# Patient Record
Sex: Female | Born: 1950 | Race: White | Hispanic: No | Smoking: Never smoker
Health system: Southern US, Community
[De-identification: ages and names within clinical notes are randomized; demographics above are authoritative.]

## PROBLEM LIST (undated history)

## (undated) DIAGNOSIS — L570 Actinic keratosis: Secondary | ICD-10-CM

## (undated) DIAGNOSIS — M1990 Unspecified osteoarthritis, unspecified site: Secondary | ICD-10-CM

## (undated) DIAGNOSIS — Z9889 Other specified postprocedural states: Secondary | ICD-10-CM

## (undated) DIAGNOSIS — R0789 Other chest pain: Secondary | ICD-10-CM

## (undated) DIAGNOSIS — Z7982 Long term (current) use of aspirin: Secondary | ICD-10-CM

## (undated) DIAGNOSIS — E782 Mixed hyperlipidemia: Secondary | ICD-10-CM

## (undated) DIAGNOSIS — Z8669 Personal history of other diseases of the nervous system and sense organs: Secondary | ICD-10-CM

## (undated) DIAGNOSIS — E785 Hyperlipidemia, unspecified: Secondary | ICD-10-CM

## (undated) DIAGNOSIS — I341 Nonrheumatic mitral (valve) prolapse: Secondary | ICD-10-CM

## (undated) DIAGNOSIS — J302 Other seasonal allergic rhinitis: Secondary | ICD-10-CM

## (undated) DIAGNOSIS — Z8679 Personal history of other diseases of the circulatory system: Secondary | ICD-10-CM

## (undated) DIAGNOSIS — I700 Atherosclerosis of aorta: Secondary | ICD-10-CM

## (undated) HISTORY — DX: Long term (current) use of aspirin: Z79.82

## (undated) HISTORY — DX: Unspecified osteoarthritis, unspecified site: M19.90

## (undated) HISTORY — PX: TUBAL LIGATION: SHX77

## (undated) HISTORY — DX: Mixed hyperlipidemia: E78.2

## (undated) HISTORY — DX: Other chest pain: R07.89

## (undated) HISTORY — DX: Personal history of other diseases of the nervous system and sense organs: Z86.69

## (undated) HISTORY — DX: Personal history of other diseases of the circulatory system: Z86.79

## (undated) HISTORY — DX: Nonrheumatic mitral (valve) prolapse: I34.1

## (undated) HISTORY — DX: Actinic keratosis: L57.0

## (undated) HISTORY — DX: Atherosclerosis of aorta: I70.0

## (undated) HISTORY — DX: Hyperlipidemia, unspecified: E78.5

## (undated) HISTORY — PX: COLONOSCOPY: SHX174

## (undated) HISTORY — DX: Other specified postprocedural states: Z98.890

## (undated) HISTORY — DX: Other seasonal allergic rhinitis: J30.2

---

## 1997-08-24 ENCOUNTER — Other Ambulatory Visit: Admission: RE | Admit: 1997-08-24 | Discharge: 1997-08-24 | Payer: Self-pay | Admitting: Gynecology

## 1999-02-21 ENCOUNTER — Other Ambulatory Visit: Admission: RE | Admit: 1999-02-21 | Discharge: 1999-02-21 | Payer: Self-pay | Admitting: Gynecology

## 2000-02-20 ENCOUNTER — Other Ambulatory Visit: Admission: RE | Admit: 2000-02-20 | Discharge: 2000-02-20 | Payer: Self-pay | Admitting: Gynecology

## 2001-01-10 HISTORY — PX: CARDIAC CATHETERIZATION: SHX172

## 2001-01-18 ENCOUNTER — Ambulatory Visit (HOSPITAL_COMMUNITY): Admission: RE | Admit: 2001-01-18 | Discharge: 2001-01-18 | Payer: Self-pay | Admitting: Cardiology

## 2001-03-16 ENCOUNTER — Other Ambulatory Visit: Admission: RE | Admit: 2001-03-16 | Discharge: 2001-03-16 | Payer: Self-pay | Admitting: Gynecology

## 2002-03-11 ENCOUNTER — Encounter: Payer: Self-pay | Admitting: Gynecology

## 2002-03-11 ENCOUNTER — Encounter: Admission: RE | Admit: 2002-03-11 | Discharge: 2002-03-11 | Payer: Self-pay | Admitting: Gynecology

## 2002-03-30 ENCOUNTER — Other Ambulatory Visit: Admission: RE | Admit: 2002-03-30 | Discharge: 2002-03-30 | Payer: Self-pay | Admitting: Gynecology

## 2003-04-03 ENCOUNTER — Other Ambulatory Visit: Admission: RE | Admit: 2003-04-03 | Discharge: 2003-04-03 | Payer: Self-pay | Admitting: Gynecology

## 2003-04-05 ENCOUNTER — Encounter: Admission: RE | Admit: 2003-04-05 | Discharge: 2003-04-05 | Payer: Self-pay | Admitting: Gynecology

## 2004-04-08 ENCOUNTER — Encounter: Admission: RE | Admit: 2004-04-08 | Discharge: 2004-04-08 | Payer: Self-pay | Admitting: Gynecology

## 2004-04-08 ENCOUNTER — Other Ambulatory Visit: Admission: RE | Admit: 2004-04-08 | Discharge: 2004-04-08 | Payer: Self-pay | Admitting: Gynecology

## 2005-04-10 ENCOUNTER — Encounter: Admission: RE | Admit: 2005-04-10 | Discharge: 2005-04-10 | Payer: Self-pay | Admitting: Gynecology

## 2005-04-10 ENCOUNTER — Other Ambulatory Visit: Admission: RE | Admit: 2005-04-10 | Discharge: 2005-04-10 | Payer: Self-pay | Admitting: Gynecology

## 2006-04-15 ENCOUNTER — Other Ambulatory Visit: Admission: RE | Admit: 2006-04-15 | Discharge: 2006-04-15 | Payer: Self-pay | Admitting: Gynecology

## 2006-04-15 ENCOUNTER — Encounter: Admission: RE | Admit: 2006-04-15 | Discharge: 2006-04-15 | Payer: Self-pay | Admitting: Gynecology

## 2006-09-16 ENCOUNTER — Ambulatory Visit (HOSPITAL_BASED_OUTPATIENT_CLINIC_OR_DEPARTMENT_OTHER): Admission: RE | Admit: 2006-09-16 | Discharge: 2006-09-16 | Payer: Self-pay | Admitting: Gynecology

## 2006-09-16 ENCOUNTER — Encounter (INDEPENDENT_AMBULATORY_CARE_PROVIDER_SITE_OTHER): Payer: Self-pay | Admitting: Gynecology

## 2007-05-18 ENCOUNTER — Encounter: Admission: RE | Admit: 2007-05-18 | Discharge: 2007-05-18 | Payer: Self-pay | Admitting: Gynecology

## 2008-05-24 ENCOUNTER — Encounter: Admission: RE | Admit: 2008-05-24 | Discharge: 2008-05-24 | Payer: Self-pay | Admitting: Gynecology

## 2009-06-06 ENCOUNTER — Encounter: Admission: RE | Admit: 2009-06-06 | Discharge: 2009-06-06 | Payer: Self-pay | Admitting: Gynecology

## 2010-06-25 NOTE — Op Note (Signed)
NAME:  Sheila Martin, Sheila Martin               ACCOUNT NO.:  [id number]   MEDICAL RECORD NO.:  [id number]          PATIENT TYPE:  AMB   LOCATION:  [facility]                         FACILITY:  [facility]   PHYSICIAN:  Gretta Cool, M.D. DATE OF BIRTH:  Jul 09, 1950   DATE OF PROCEDURE:  09/16/2006  DATE OF DISCHARGE:                               OPERATIVE REPORT   PREOPERATIVE DIAGNOSES:  Postmenopausal bleeding with endometrial polyp  by ultrasound and confirmed by D&C with removal of a portion of the  polyp.   POSTOPERATIVE DIAGNOSES:  Postmenopausal bleeding with endometrial polyp  by ultrasound and confirmed by D&C with removal of a portion of the  polyp.   PROCEDURE:  Hysteroscopy resection of the endometrium totaled for  ablation plus VaporTrode.   SURGEON:  Gretta Cool, M.D.   ANESTHESIA:  IV sedation and paracervical block.   DESCRIPTION OF PROCEDURE:  Under excellent anesthesia as above with the  patient prepped and draped in Allen stirrups, a weighted speculum was  placed in the vagina and cervix grasped with a single tooth tenaculum  and pulled down into view. He was then progressively dilated with a  series of Pratt dilators to accommodate the 7-mm resectoscope. The  resectoscope was then introduced and the cavity examined by polyp  forceps. The entire endometrial cavity was then systemically resected so  as to remove all of the endometrial tissue visible. There was no  significant residual of the endometrial polyp removed by D&C. At this  point, the procedure was terminated without complications and the  patient returned to the recovery room in excellent condition.           ______________________________  Gretta Cool, M.D.     CWL/MEDQ  D:  09/16/2006  T:  09/16/2006  Job:  [id number]

## 2010-06-28 NOTE — Cardiovascular Report (Signed)
[place]  Patient:    Sheila Martin, Sheila Martin Visit Number: [id number] MRN: [id number]          Service Type: CAT Location: [facility] 2860 [id number] Attending Physician:  Rollene Rotunda Dictated by:   Rollene Rotunda, M.D. [facility] Proc. Date: 01/18/01 Admit Date:  01/18/2001   CC:         Norval Morton, M.D.  Heart Center, [facility]   Cardiac Catheterization  DATE OF BIRTH:  Jul 23, 1950  PROCEDURE:  Left heart catheterization/coronary arteriography.  CARDIOLOGIST:  Rollene Rotunda, M.D. [facility]  INDICATION:  Evaluate patient with chest pain and a Cardiolite which suggested reversible ischemia.  PROCEDURAL NOTE:  Left heart catheterization was performed via the right femoral artery.  The artery was cannulated using anterior wall puncture.  A #6 French arterial sheath was inserted via the modified Seldinger technique. Preformed Judkins and a pigtail catheter were inserted.   The Judkins were utilized to cannulate the left main and right coronary.  The pigtail was inserted into the left ventricle for left ventriculogram.  The patient tolerated the procedure well and left the lab in stable condition.  RESULTS:  HEMODYNAMICS:  LV 152/20, AO 153/78.  CORONARIES:  The left main was normal.  Left anterior descending artery:  The LAD was normal.  Circumflex:  The circumflex was normal.  Right coronary artery:  The right coronary artery was nondominant and normal.  LEFT VENTRICULOGRAM:  A left ventriculogram was obtained in the RAO projection.  The EF was 55% with normal wall motion.  There was mild mitral valve prolapse without regurgitation noted.  CONCLUSION: 1. Normal coronary arteries. 2. Well preserved left ventricular function. 3. Mild mitral valve prolapse.  PLAN:  No further cardiac workup is planned.  The patient will continue to follow up with her primary care doctor for evaluation of non-anginal etiologies. Dictated by:   Rollene Rotunda,  M.D. [facility] Attending Physician:  Rollene Rotunda DD:  01/18/01 TD:  01/18/01 Job: [id number]

## 2010-07-01 ENCOUNTER — Other Ambulatory Visit: Payer: Self-pay | Admitting: Gynecology

## 2010-07-01 DIAGNOSIS — Z1231 Encounter for screening mammogram for malignant neoplasm of breast: Secondary | ICD-10-CM

## 2010-07-18 ENCOUNTER — Ambulatory Visit
Admission: RE | Admit: 2010-07-18 | Discharge: 2010-07-18 | Disposition: A | Discharge status: Home / Self Care | Payer: BC Managed Care – PPO | Source: Ambulatory Visit | Attending: Gynecology | Admitting: Gynecology

## 2010-07-18 DIAGNOSIS — Z1231 Encounter for screening mammogram for malignant neoplasm of breast: Secondary | ICD-10-CM

## 2011-05-21 ENCOUNTER — Encounter: Payer: Self-pay | Admitting: Physician Assistant

## 2011-05-21 ENCOUNTER — Encounter: Payer: Self-pay | Admitting: Internal Medicine

## 2011-05-21 ENCOUNTER — Other Ambulatory Visit: Payer: Self-pay | Admitting: Internal Medicine

## 2011-05-22 ENCOUNTER — Encounter: Payer: Self-pay | Admitting: Internal Medicine

## 2011-05-22 DIAGNOSIS — R079 Chest pain, unspecified: Secondary | ICD-10-CM

## 2011-06-09 ENCOUNTER — Encounter: Payer: Self-pay | Admitting: *Deleted

## 2011-06-13 ENCOUNTER — Encounter: Payer: BC Managed Care – PPO | Admitting: Physician Assistant

## 2011-07-09 ENCOUNTER — Encounter: Payer: Self-pay | Admitting: Physician Assistant

## 2011-07-09 ENCOUNTER — Encounter: Payer: BC Managed Care – PPO | Admitting: Physician Assistant

## 2011-08-21 ENCOUNTER — Other Ambulatory Visit: Payer: Self-pay | Admitting: Gynecology

## 2011-08-21 DIAGNOSIS — Z1231 Encounter for screening mammogram for malignant neoplasm of breast: Secondary | ICD-10-CM

## 2011-09-09 ENCOUNTER — Ambulatory Visit: Payer: BC Managed Care – PPO

## 2011-09-10 ENCOUNTER — Ambulatory Visit
Admission: RE | Admit: 2011-09-10 | Discharge: 2011-09-10 | Disposition: A | Discharge status: Home / Self Care | Payer: BC Managed Care – PPO | Source: Ambulatory Visit | Attending: Gynecology | Admitting: Gynecology

## 2011-09-10 DIAGNOSIS — Z1231 Encounter for screening mammogram for malignant neoplasm of breast: Secondary | ICD-10-CM

## 2012-04-13 ENCOUNTER — Other Ambulatory Visit: Payer: Self-pay | Admitting: Gynecology

## 2012-04-20 ENCOUNTER — Ambulatory Visit
Admission: RE | Admit: 2012-04-20 | Discharge: 2012-04-20 | Disposition: A | Discharge status: Home / Self Care | Payer: BC Managed Care – PPO | Source: Ambulatory Visit | Attending: Gynecology | Admitting: Gynecology

## 2012-04-20 DIAGNOSIS — M8580 Other specified disorders of bone density and structure, unspecified site: Secondary | ICD-10-CM

## 2014-03-29 ENCOUNTER — Other Ambulatory Visit: Payer: Self-pay

## 2014-03-29 DIAGNOSIS — Z1231 Encounter for screening mammogram for malignant neoplasm of breast: Secondary | ICD-10-CM

## 2014-04-03 ENCOUNTER — Ambulatory Visit
Admission: RE | Admit: 2014-04-03 | Discharge: 2014-04-03 | Disposition: A | Discharge status: Home / Self Care | Payer: Medicare HMO | Source: Ambulatory Visit

## 2014-04-03 ENCOUNTER — Other Ambulatory Visit: Payer: Self-pay

## 2014-04-03 DIAGNOSIS — Z1231 Encounter for screening mammogram for malignant neoplasm of breast: Secondary | ICD-10-CM

## 2014-08-29 ENCOUNTER — Encounter (INDEPENDENT_AMBULATORY_CARE_PROVIDER_SITE_OTHER): Payer: Self-pay | Admitting: *Deleted

## 2014-09-19 ENCOUNTER — Encounter (INDEPENDENT_AMBULATORY_CARE_PROVIDER_SITE_OTHER): Payer: Self-pay | Admitting: Internal Medicine

## 2014-09-19 ENCOUNTER — Telehealth (INDEPENDENT_AMBULATORY_CARE_PROVIDER_SITE_OTHER): Payer: Self-pay | Admitting: *Deleted

## 2014-09-19 ENCOUNTER — Other Ambulatory Visit (INDEPENDENT_AMBULATORY_CARE_PROVIDER_SITE_OTHER): Payer: Self-pay | Admitting: *Deleted

## 2014-09-19 ENCOUNTER — Ambulatory Visit (INDEPENDENT_AMBULATORY_CARE_PROVIDER_SITE_OTHER): Payer: Medicare PPO | Admitting: Internal Medicine

## 2014-09-19 VITALS — BP 104/62 | HR 64 | Temp 98.2°F | Ht 64.0 in | Wt 131.3 lb

## 2014-09-19 DIAGNOSIS — Z1211 Encounter for screening for malignant neoplasm of colon: Secondary | ICD-10-CM

## 2014-09-19 DIAGNOSIS — E7800 Pure hypercholesterolemia, unspecified: Secondary | ICD-10-CM | POA: Insufficient documentation

## 2014-09-19 DIAGNOSIS — G43909 Migraine, unspecified, not intractable, without status migrainosus: Secondary | ICD-10-CM | POA: Insufficient documentation

## 2014-09-19 MED ORDER — PEG 3350-KCL-NA BICARB-NACL 420 G PO SOLR: 4000.0000 mL | Freq: Once | ORAL | Status: DC

## 2014-09-19 NOTE — Telephone Encounter (Signed)
Patient needs trilyte 

## 2014-09-19 NOTE — Patient Instructions (Addendum)
Screening colonoscopy.The risks and benefits such as perforation, bleeding, and infection were reviewed with the patient and is agreeable. 

## 2014-09-19 NOTE — Progress Notes (Addendum)
   Subjective:    Patient ID: Sheila Martin, female    DOB: 02/19/1950, 64 y.o.   MRN: [id number]  HPI Referred to our office by Dr. Sherril Croon for screening colonoscopy average risk. She underwent a colonoscopy 05/10/2014 by Dr .Marcha Solders. There was a stricture in the sigmoid colon. Retroflexed views revealed no abnormalities. Unable to pass scope thru sigmoid colon.  04/14/2014: Air Contrast BE:  There is significant sigmoid diverticulosis without evidence of acute diverticulitis. Scattered diverticuli are noted elsewhere in the bowel. No annular constricting lesions were demonstrated. No definite polypoid masses were observed though the study was somewhat limited due to the retention of barium especially on the overhead images. Her last colonoscopy was in 2004 by Dr. Cleotis Nipper (change in bowel habits).  Colonoscope passed all the way to the cecum. The ileocecal valve was visualized. Patient had a very tortuous rectosigmoid colon. There were no polyps or other neoplasm seen. No inflammation seen. No diverticular disease seen. In the rectum a J-maneuver was performed and no abnormalities were noted.  She is not having any trouble with her BMs. She has a BM daily. No melena or BRRB. Appetite is good. No weight loss. No abdominal pain. No acid reflux.  Review of Systems Past Medical History  Diagnosis Date  . History of migraine   . Osteoarthritis   . Other and unspecified hyperlipidemia   . Mitral valve prolapse     without mitral regurgitation  . Seasonal allergies   . Actinic keratosis   . Other chest pain   . Other and unspecified hyperlipidemia   . Actinic keratosis   . Encounter for long-term (current) use of aspirin   . Personal history of other diseases of circulatory system     Past Surgical History  Procedure Laterality Date  . Tubal ligation    . Cardiac catheterization  01/2001    [facility]/Normal coronary arteries,normal ejection fraction    No Known Allergies  Current Outpatient  Prescriptions on File Prior to Visit  Medication Sig Dispense Refill  . aspirin EC 81 MG tablet Take 81 mg by mouth daily.    . cholecalciferol (VITAMIN D) 1000 UNITS tablet Take 2,000 Units by mouth daily.     . citalopram (CELEXA) 10 MG tablet Take 10 mg by mouth daily.    . metoprolol succinate (TOPROL-XL) 25 MG 24 hr tablet Take 50 mg by mouth daily.      No current facility-administered medications on file prior to visit.        Objective:   Physical Exam Blood pressure 104/62, pulse 64, temperature 98.2 F (36.8 C), height  (1.626 m), weight 131 lb 4.8 oz (59.557 kg).  Alert and oriented. Skin warm and dry. Oral mucosa is moist.   . Sclera anicteric, conjunctivae is pink. Thyroid not enlarged. No cervical lymphadenopathy. Lungs clear. Heart regular rate and rhythm.  Abdomen is soft. Bowel sounds are positive. No hepatomegaly. No abdominal masses felt. No tenderness.  No edema to lower extremities. Patient is alert and oriented.       Assessment & Plan:  Screening colonoscopy. I discussed with Dr. Karilyn Cota.  Colonoscopy in March was incomplete.The risks and benefits such as perforation, bleeding, and infection were reviewed with the patient and is agreeable.

## 2014-09-20 ENCOUNTER — Encounter (INDEPENDENT_AMBULATORY_CARE_PROVIDER_SITE_OTHER): Payer: Self-pay

## 2014-10-25 ENCOUNTER — Ambulatory Visit (HOSPITAL_COMMUNITY)
Admission: RE | Admit: 2014-10-25 | Discharge: 2014-10-25 | Disposition: A | Discharge status: Home / Self Care | Payer: Medicare PPO | Source: Ambulatory Visit | Attending: Internal Medicine | Admitting: Internal Medicine

## 2014-10-25 ENCOUNTER — Encounter (HOSPITAL_COMMUNITY)
Admission: RE | Disposition: A | Discharge status: Home / Self Care | Payer: Self-pay | Source: Ambulatory Visit | Attending: Internal Medicine

## 2014-10-25 ENCOUNTER — Encounter (HOSPITAL_COMMUNITY): Payer: Self-pay | Admitting: *Deleted

## 2014-10-25 DIAGNOSIS — K644 Residual hemorrhoidal skin tags: Secondary | ICD-10-CM | POA: Insufficient documentation

## 2014-10-25 DIAGNOSIS — Q438 Other specified congenital malformations of intestine: Secondary | ICD-10-CM | POA: Insufficient documentation

## 2014-10-25 DIAGNOSIS — K5730 Diverticulosis of large intestine without perforation or abscess without bleeding: Secondary | ICD-10-CM | POA: Insufficient documentation

## 2014-10-25 DIAGNOSIS — Z7982 Long term (current) use of aspirin: Secondary | ICD-10-CM | POA: Diagnosis not present

## 2014-10-25 DIAGNOSIS — Z1211 Encounter for screening for malignant neoplasm of colon: Secondary | ICD-10-CM | POA: Insufficient documentation

## 2014-10-25 DIAGNOSIS — K648 Other hemorrhoids: Secondary | ICD-10-CM | POA: Diagnosis not present

## 2014-10-25 HISTORY — PX: COLONOSCOPY: SHX5424

## 2014-10-25 SURGERY — COLONOSCOPY
Anesthesia: Moderate Sedation

## 2014-10-25 MED ORDER — SODIUM CHLORIDE 0.9 % IV SOLN
INTRAVENOUS | Status: DC
  Administered 2014-10-25: 08:00:00 via INTRAVENOUS

## 2014-10-25 MED ORDER — MIDAZOLAM HCL 5 MG/5ML IJ SOLN
INTRAMUSCULAR | Status: AC
  Filled 2014-10-25: qty 10

## 2014-10-25 MED ORDER — MEPERIDINE HCL 50 MG/ML IJ SOLN
INTRAMUSCULAR | Status: AC
  Filled 2014-10-25: qty 1

## 2014-10-25 MED ORDER — MEPERIDINE HCL 50 MG/ML IJ SOLN
INTRAMUSCULAR | Status: DC | PRN
  Administered 2014-10-25 (×2): 25 mg

## 2014-10-25 MED ORDER — STERILE WATER FOR IRRIGATION IR SOLN
Status: DC | PRN
  Administered 2014-10-25: 09:00:00

## 2014-10-25 MED ORDER — MIDAZOLAM HCL 5 MG/5ML IJ SOLN
INTRAMUSCULAR | Status: DC | PRN
  Administered 2014-10-25 (×2): 1 mg via INTRAVENOUS
  Administered 2014-10-25: 2 mg via INTRAVENOUS

## 2014-10-25 NOTE — Discharge Instructions (Signed)
Resume usual medications. High fiber diet. No driving for 24 hours. Next screening exam in 10 years.  Colonoscopy, Care After These instructions give you information on caring for yourself after your procedure. Your doctor may also give you more specific instructions. Call your doctor if you have any problems or questions after your procedure. HOME CARE  Do not drive for 24 hours.  Do not sign important papers or use machinery for 24 hours.  You may shower.  You may go back to your usual activities, but go slower for the first 24 hours.  Take rest breaks often during the first 24 hours.  Walk around or use warm packs on your belly (abdomen) if you have belly cramping or gas.  Drink enough fluids to keep your pee (urine) clear or pale yellow.  Resume your normal diet. Avoid heavy or fried foods.  Avoid drinking alcohol for 24 hours or as told by your doctor.  Only take medicines as told by your doctor. If a tissue sample (biopsy) was taken during the procedure:   Do not take aspirin or blood thinners for 7 days, or as told by your doctor.  Do not drink alcohol for 7 days, or as told by your doctor.  Eat soft foods for the first 24 hours. GET HELP IF: You still have a small amount of blood in your poop (stool) 2-3 days after the procedure. GET HELP RIGHT AWAY IF:  You have more than a small amount of blood in your poop.  You see clumps of tissue (blood clots) in your poop.  Your belly is puffy (swollen).  You feel sick to your stomach (nauseous) or throw up (vomit).  You have a fever.  You have belly pain that gets worse and medicine does not help. MAKE SURE YOU:  Understand these instructions.  Will watch your condition.  Will get help right away if you are not doing well or get worse. Document Released: 03/01/2010 Document Revised: 02/01/2013 Document Reviewed: 10/04/2012 [facility] Patient Information 2015 [place], [place]. This information is not intended to  replace advice given to you by your health care provider. Make sure you discuss any questions you have with your health care provider.   High-Fiber Diet Fiber is found in fruits, vegetables, and grains. A high-fiber diet encourages the addition of more whole grains, legumes, fruits, and vegetables in your diet. The recommended amount of fiber for adult males is 38 g per day. For adult females, it is 25 g per day. Pregnant and lactating women should get 28 g of fiber per day. If you have a digestive or bowel problem, ask your caregiver for advice before adding high-fiber foods to your diet. Eat a variety of high-fiber foods instead of only a select few type of foods.  PURPOSE  To increase stool bulk.  To make bowel movements more regular to prevent constipation.  To lower cholesterol.  To prevent overeating. WHEN IS THIS DIET USED?  It may be used if you have constipation and hemorrhoids.  It may be used if you have uncomplicated diverticulosis (intestine condition) and irritable bowel syndrome.  It may be used if you need help with weight management.  It may be used if you want to add it to your diet as a protective measure against atherosclerosis, diabetes, and cancer. SOURCES OF FIBER  Whole-grain breads and cereals.  Fruits, such as apples, oranges, bananas, berries, prunes, and pears.  Vegetables, such as green peas, carrots, sweet potatoes, beets, broccoli, cabbage, spinach,  and artichokes.  Legumes, such split peas, soy, lentils.  Almonds. FIBER CONTENT IN FOODS Starches and Grains / Dietary Fiber (g)  Cheerios, 1 cup / 3 g  Corn Flakes cereal, 1 cup / 0.7 g  Rice crispy treat cereal, 1 cup / 0.3 g  Instant oatmeal (cooked),  cup / 2 g  Frosted wheat cereal, 1 cup / 5.1 g  Brown, long-grain rice (cooked), 1 cup / 3.5 g  White, long-grain rice (cooked), 1 cup / 0.6 g  Enriched macaroni (cooked), 1 cup / 2.5 g Legumes / Dietary Fiber (g)  Baked beans  (canned, plain, or vegetarian),  cup / 5.2 g  Kidney beans (canned),  cup / 6.8 g  Pinto beans (cooked),  cup / 5.5 g Breads and Crackers / Dietary Fiber (g)  Plain or honey graham crackers, 2 squares / 0.7 g  Saltine crackers, 3 squares / 0.3 g  Plain, salted pretzels, 10 pieces / 1.8 g  Whole-wheat bread, 1 slice / 1.9 g  White bread, 1 slice / 0.7 g  Raisin bread, 1 slice / 1.2 g  Plain bagel, 3 oz / 2 g  Flour tortilla, 1 oz / 0.9 g  Corn tortilla, 1 small / 1.5 g  Hamburger or hotdog bun, 1 small / 0.9 g Fruits / Dietary Fiber (g)  Apple with skin, 1 medium / 4.4 g  Sweetened applesauce,  cup / 1.5 g  Banana,  medium / 1.5 g  Grapes, 10 grapes / 0.4 g  Orange, 1 small / 2.3 g  Raisin, 1.5 oz / 1.6 g  Melon, 1 cup / 1.4 g Vegetables / Dietary Fiber (g)  Green beans (canned),  cup / 1.3 g  Carrots (cooked),  cup / 2.3 g  Broccoli (cooked),  cup / 2.8 g  Peas (cooked),  cup / 4.4 g  Mashed potatoes,  cup / 1.6 g  Lettuce, 1 cup / 0.5 g  Corn (canned),  cup / 1.6 g  Tomato,  cup / 1.1 g Document Released: 01/27/2005 Document Revised: 07/29/2011 Document Reviewed: 05/01/2011 [employer] Patient Information 2015 [place], [place]. This information is not intended to replace advice given to you by your health care provider. Make sure you discuss any questions you have with your health care provider.

## 2014-10-25 NOTE — H&P (Signed)
Sheila Martin is an 64 y.o. female.   Chief Complaint: Patient is here for colonoscopy. HPI: This 64 year old Caucasian female who underwent screening colonoscopy in March 2016 at [facility] and was incomplete exam thought to be due to sigmoid colon stricture. Subsequent barium enema did not reveal any abnormality. Patient has requested colonoscopy in order to complete exam. She denies abdominal pain change in her bowel habits or rectal bleeding. Last exam was in October 2004 by Dr. Cleotis Nipper and was normal. Family history is negative for CRC.  Past Medical History  Diagnosis Date  . History of migraine   . Osteoarthritis   . Other and unspecified hyperlipidemia   . Mitral valve prolapse     without mitral regurgitation  . Seasonal allergies   . Actinic keratosis   . Other chest pain   . Other and unspecified hyperlipidemia   . Actinic keratosis   . Encounter for long-term (current) use of aspirin   . Personal history of other diseases of circulatory system     Past Surgical History  Procedure Laterality Date  . Tubal ligation    . Cardiac catheterization  01/2001    [facility]/Normal coronary arteries,normal ejection fraction  . Colonoscopy       2004 and Incomplete 04/13/2014    Family History  Problem Relation Age of Onset  . Stroke Mother   . Heart attack Father   . Stroke Father    Social History:  reports that she has never smoked. She does not have any smokeless tobacco history on file. She reports that she drinks alcohol. She reports that she does not use illicit drugs.  Allergies: No Known Allergies  Medications Prior to Admission  Medication Sig Dispense Refill  . aspirin EC 81 MG tablet Take 81 mg by mouth daily.    . Calcium Carbonate (CALTRATE 600 PO) Take by mouth.    . cholecalciferol (VITAMIN D) 1000 UNITS tablet Take 2,000 Units by mouth daily.     . citalopram (CELEXA) 10 MG tablet Take 10 mg by mouth daily.    Marland Kitchen co-enzyme Q-10 30 MG capsule Take 30 mg by mouth 3  (three) times daily.    . metoprolol succinate (TOPROL-XL) 25 MG 24 hr tablet Take 50 mg by mouth daily.     . polyethylene glycol-electrolytes (NULYTELY/GOLYTELY) 420 G solution Take 4,000 mLs by mouth once. 4000 mL 0  . simvastatin (ZOCOR) 10 MG tablet Take 10 mg by mouth daily.    . Eletriptan Hydrobromide (RELPAX PO) Take by mouth.      No results found for this or any previous visit (from the past 48 hour(s)). No results found.  ROS  Blood pressure 122/83, pulse 70, temperature 98.3 F (36.8 C), temperature source Oral, resp. rate 14, height 5\' 4"  (1.626 m), weight 130 lb (58.968 kg), SpO2 100 %. Physical Exam  Constitutional: She appears well-developed and well-nourished.  HENT:  Mouth/Throat: Oropharynx is clear and moist.  Eyes: Conjunctivae are normal. No scleral icterus.  Neck: No thyromegaly present.  Cardiovascular: Normal rate, regular rhythm and normal heart sounds.   No murmur heard. Respiratory: Effort normal and breath sounds normal.  GI: Soft. She exhibits no distension and no mass. There is no tenderness.  Musculoskeletal: She exhibits no edema.  Lymphadenopathy:    She has no cervical adenopathy.  Neurological: She is alert.  Skin: Skin is warm and dry.     Assessment/Plan Average risk screening colonoscopy. Recent colonoscopy at outside institution was incomplete.  REHMAN,NAJEEB U 10/25/2014, 8:32 AM

## 2014-10-25 NOTE — Op Note (Signed)
COLONOSCOPY PROCEDURE REPORT  PATIENT:  Sheila Martin  MR#:  [id number] Birthdate:  1950-12-22, 64 y.o., female Endoscopist:  Dr. Malissa Hippo, MD Referred By:  Dr. Ignatius Specking, MD Procedure Date: 10/25/2014  Procedure:   Colonoscopy  Indications: Patient is 64 year old Caucasian female who is in for average risk screening colonoscopy. She had a complete exam in October 2004. She underwent colonoscopy in March 2016 at [facility] she was felt to have a sigmoid colon stricture. However barium enema did not show any stricture. She is interested in having complete exam.  Informed Consent:  The procedure and risks were reviewed with the patient and informed consent was obtained.  Medications:  Demerol 50 mg IV Versed 4 mg IV  Description of procedure:  After a digital rectal exam was performed, that colonoscope was advanced from the anus through the rectum and colon to the area of the cecum, ileocecal valve and appendiceal orifice. The cecum was deeply intubated. These structures were well-seen and photographed for the record. From the level of the cecum and ileocecal valve, the scope was slowly and cautiously withdrawn. The mucosal surfaces were carefully surveyed utilizing scope tip to flexion to facilitate fold flattening as needed. The scope was pulled down into the rectum where a thorough exam including retroflexion was performed.  Findings:   Prep excellent. Normal mucosa of cecum, ascending colon, hepatic flexure, transverse colon, splenic flexure and sigmoid colon. Moderate number of diverticula noted at sigmoid colon which was tortuous no stricture identified. Normal rectal mucosa. Hemorrhoids below the dentate line.   Therapeutic/Diagnostic Maneuvers Performed:   None  Complications:  None  EBL: None  Cecal Withdrawal Time:  7 minutes  Impression:  Examination performed to cecum. Tortuous sigmoid colon with moderate number of diverticula but no evidence of colonic stricture  or polyps. Small external hemorrhoids.  Recommendations:  Standard instructions given. High fiber diet. Next screening exam in 10 years.  Lianne Carreto U  10/25/2014 9:22 AM  CC: Dr. Ignatius Specking., MD & Dr. Bonnetta Barry ref. provider found

## 2014-10-26 ENCOUNTER — Encounter (HOSPITAL_COMMUNITY): Payer: Self-pay | Admitting: Internal Medicine

## 2015-03-09 ENCOUNTER — Other Ambulatory Visit: Payer: Self-pay

## 2015-03-09 DIAGNOSIS — Z1231 Encounter for screening mammogram for malignant neoplasm of breast: Secondary | ICD-10-CM

## 2015-04-09 ENCOUNTER — Ambulatory Visit
Admission: RE | Admit: 2015-04-09 | Discharge: 2015-04-09 | Disposition: A | Discharge status: Home / Self Care | Payer: Medicare Other | Source: Ambulatory Visit

## 2015-04-09 DIAGNOSIS — Z1231 Encounter for screening mammogram for malignant neoplasm of breast: Secondary | ICD-10-CM

## 2016-03-31 ENCOUNTER — Other Ambulatory Visit: Payer: Self-pay | Admitting: Internal Medicine

## 2016-03-31 DIAGNOSIS — Z1231 Encounter for screening mammogram for malignant neoplasm of breast: Secondary | ICD-10-CM

## 2016-04-09 ENCOUNTER — Ambulatory Visit: Payer: Medicare Other

## 2016-04-24 ENCOUNTER — Ambulatory Visit
Admission: RE | Admit: 2016-04-24 | Discharge: 2016-04-24 | Disposition: A | Discharge status: Home / Self Care | Payer: Medicare Other | Source: Ambulatory Visit | Attending: Internal Medicine | Admitting: Internal Medicine

## 2016-04-24 DIAGNOSIS — Z1231 Encounter for screening mammogram for malignant neoplasm of breast: Secondary | ICD-10-CM

## 2016-06-01 IMAGING — MG MM SCREENING BREAST TOMO BILATERAL
8 series · 9 of 24 positions shown · non-contrast
Comparison: Previous exam(s).

ACR Breast Density Category a: The breast tissue is almost entirely
fatty.

CLINICAL DATA: Screening.

EXAM:
DIGITAL SCREENING BILATERAL MAMMOGRAM WITH 3D TOMO WITH CAD

[R CC]
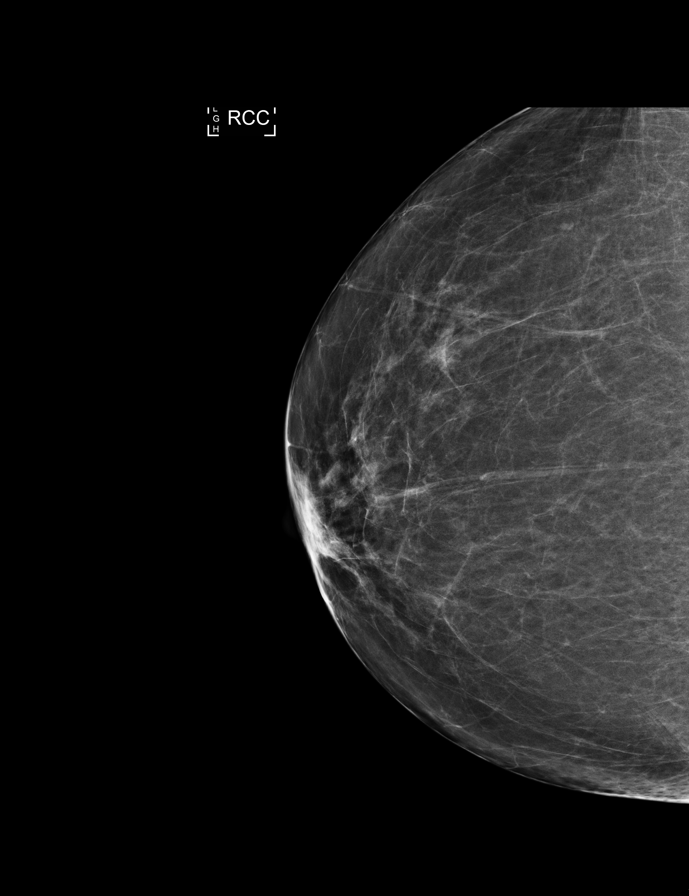

[L MLO]
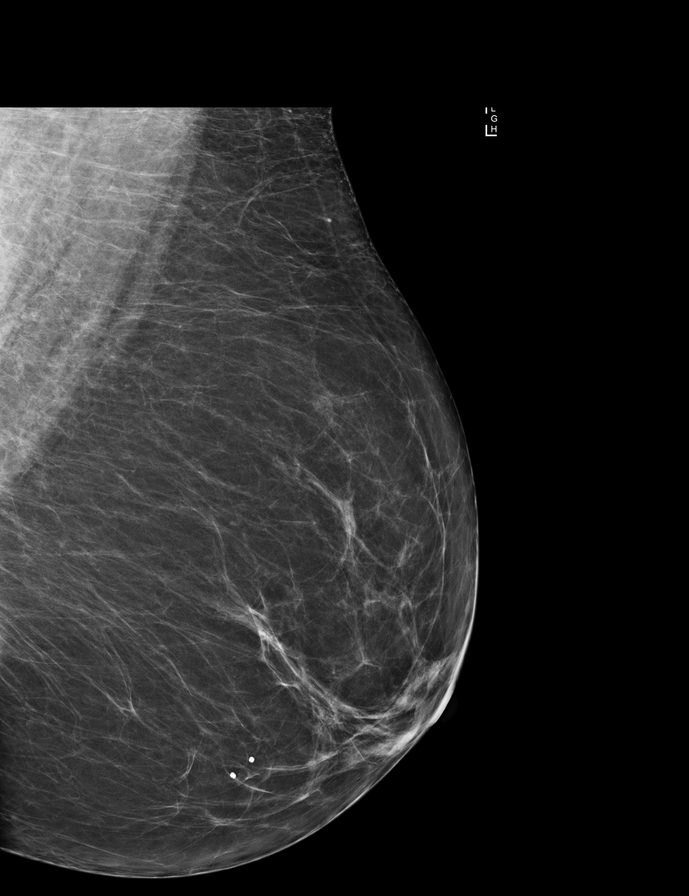

[L CC]
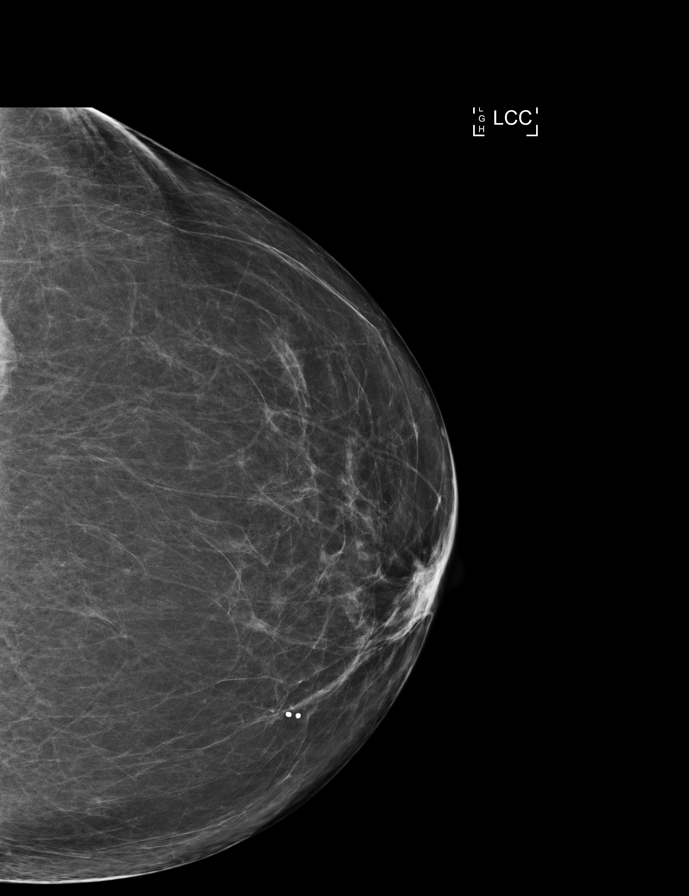

[R MLO]
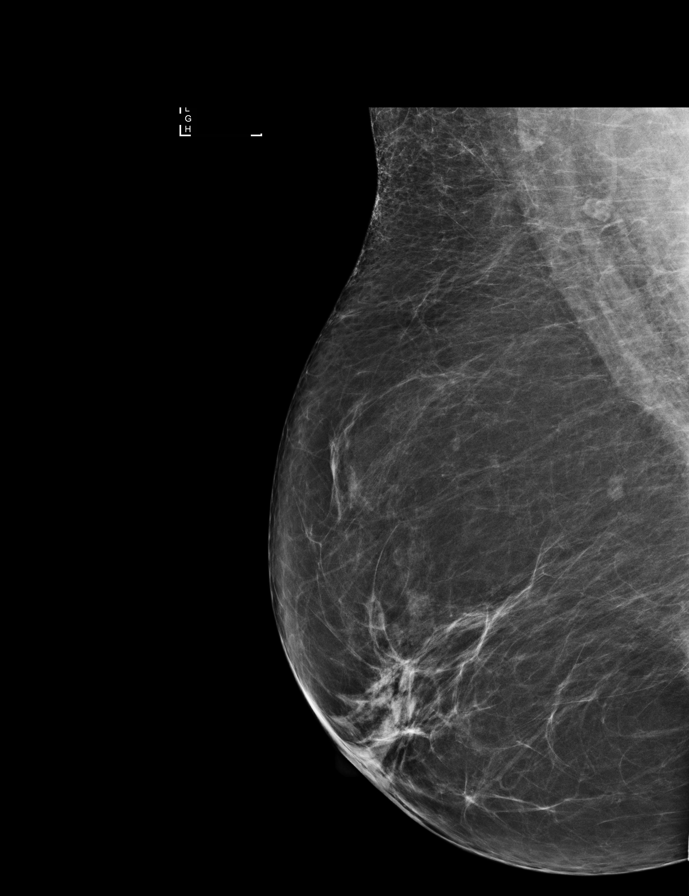

[R CC tomo · 2 of 72 frames shown]
[frame 24/72]
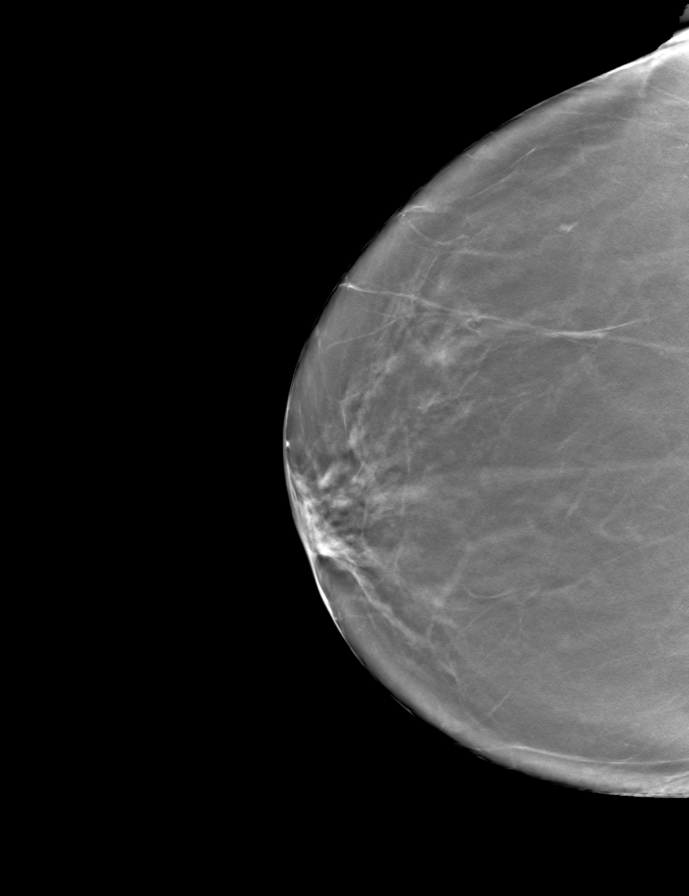
[frame 37/72]
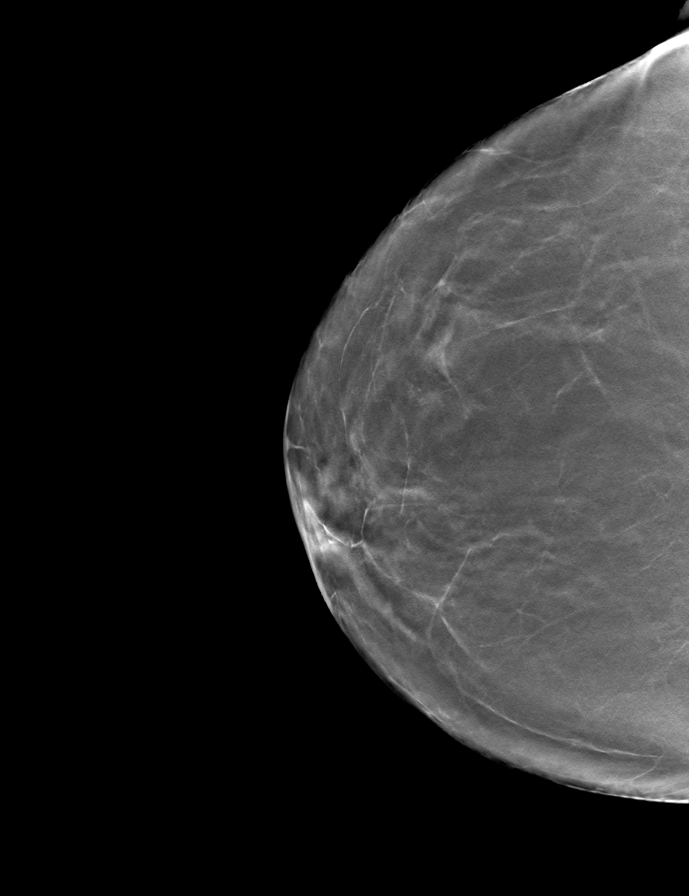

[L CC tomo · tomo slice 40/79.0]
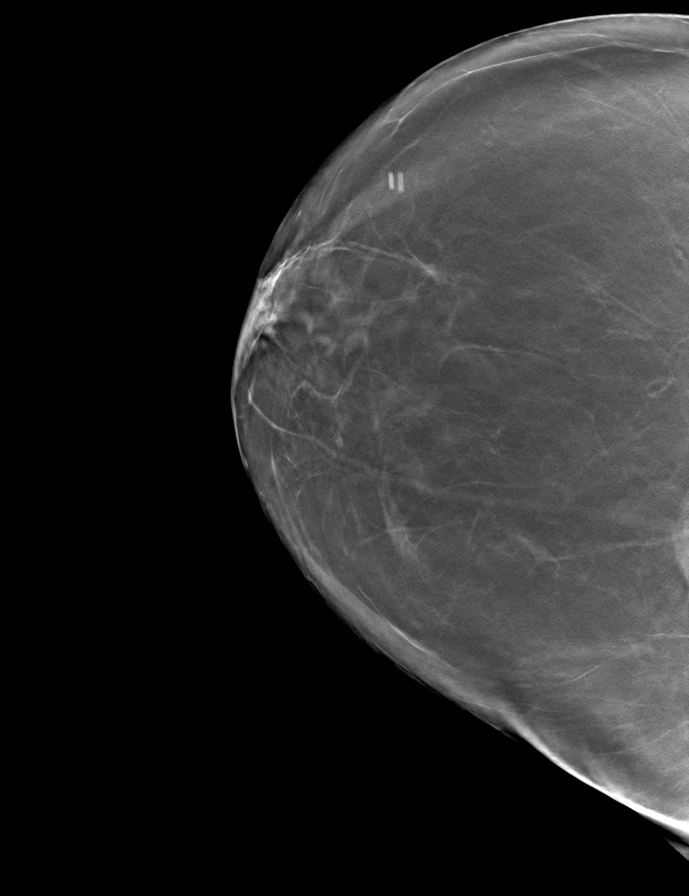

[L MLO tomo · tomo slice 41/81.0]
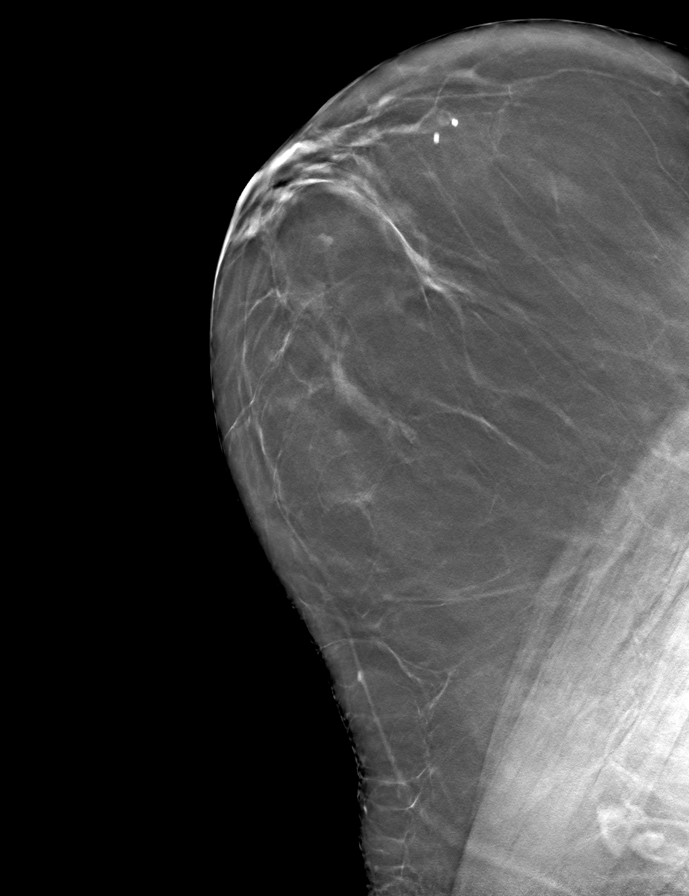

[R MLO tomo · tomo slice 39/77.0]
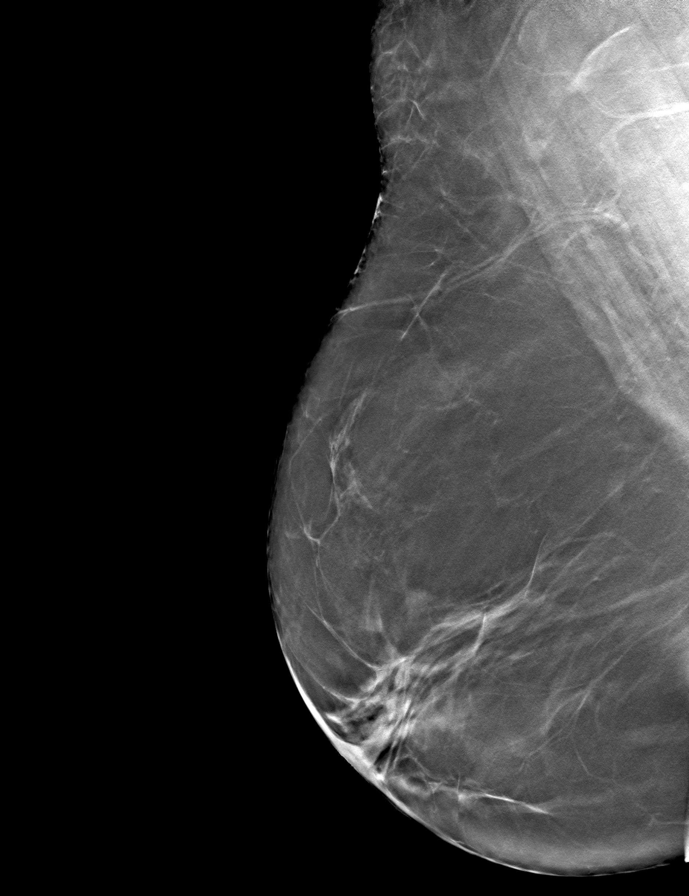

[9 of 24 positions shown; findings below may reference images not displayed]

FINDINGS: There are no findings suspicious for malignancy. Images were
processed with CAD.
IMPRESSION: No mammographic evidence of malignancy. A result letter of this
screening mammogram will be mailed directly to the patient.

RECOMMENDATION:
Screening mammogram in one year. (Code:HC-M-GZ7)

BI-RADS CATEGORY  1: Negative.

## 2017-02-12 ENCOUNTER — Other Ambulatory Visit: Payer: Self-pay | Admitting: Internal Medicine

## 2017-02-12 DIAGNOSIS — Z1231 Encounter for screening mammogram for malignant neoplasm of breast: Secondary | ICD-10-CM

## 2017-04-27 ENCOUNTER — Ambulatory Visit: Payer: Medicare Other

## 2017-06-22 ENCOUNTER — Ambulatory Visit: Payer: Medicare Other

## 2017-07-15 ENCOUNTER — Ambulatory Visit
Admission: RE | Admit: 2017-07-15 | Discharge: 2017-07-15 | Disposition: A | Discharge status: Home / Self Care | Payer: Medicare Other | Source: Ambulatory Visit | Attending: Internal Medicine | Admitting: Internal Medicine

## 2017-07-15 DIAGNOSIS — Z1231 Encounter for screening mammogram for malignant neoplasm of breast: Secondary | ICD-10-CM

## 2017-11-02 ENCOUNTER — Encounter: Payer: Self-pay | Admitting: *Deleted

## 2017-11-03 ENCOUNTER — Ambulatory Visit: Payer: Medicare Other | Admitting: Cardiology

## 2017-11-03 ENCOUNTER — Encounter: Payer: Self-pay | Admitting: Cardiology

## 2017-11-03 VITALS — BP 130/84 | HR 61 | Ht 63.0 in | Wt 131.0 lb

## 2017-11-03 DIAGNOSIS — R0789 Other chest pain: Secondary | ICD-10-CM

## 2017-11-03 NOTE — Progress Notes (Signed)
Clinical Summary Ms. Heikes is a 67 y.o.female seen today as a new consult, referred by Dr Sherril CroonVyas for chest pan.   1. Chest pain - [facility] with chest pain - cardiac enzymes negative. CXR no acute process. EKG SR, LAFB.  - 09[facility]: LVEF 70-75%,  - 01/2001 cath normal coronaries   - symptomst started 09/2017. Pressure pain midchest, 8/10 in severity. Rest or with exertion. No other associated symptoms. Pressure would last just a few minutes. Could be worst with position. - symptoms have resolved. Total of 2-3 days of symptoms.   - sedentary lifestyle. Highest level of activity is housework or yardwork which she overall tolerates - takes metoprolol as needed for chronic chest pain for several years Past Medical History:  Diagnosis Date  . Actinic keratosis   . Actinic keratosis   . Encounter for long-term (current) use of aspirin   . History of migraine   . Mitral valve prolapse    without mitral regurgitation  . Osteoarthritis   . Other and unspecified hyperlipidemia   . Other and unspecified hyperlipidemia   . Other chest pain   . Personal history of other diseases of circulatory system   . Seasonal allergies      No Known Allergies   Current Outpatient Medications  Medication Sig Dispense Refill  . aspirin EC 81 MG tablet Take 81 mg by mouth daily.    . Calcium Carbonate (CALTRATE 600 PO) Take by mouth.    . cholecalciferol (VITAMIN D) 1000 UNITS tablet Take 2,000 Units by mouth daily.     . citalopram (CELEXA) 10 MG tablet Take 10 mg by mouth daily.    Marland Kitchen. co-enzyme Q-10 30 MG capsule Take 30 mg by mouth 3 (three) times daily.    . Eletriptan Hydrobromide (RELPAX PO) Take by mouth.    . metoprolol succinate (TOPROL-XL) 25 MG 24 hr tablet Take 50 mg by mouth daily.     . simvastatin (ZOCOR) 10 MG tablet Take 10 mg by mouth daily.     No current facility-administered medications for this visit.      Past Surgical  History:  Procedure Laterality Date  . CARDIAC CATHETERIZATION  01/2001   [facility]/Normal coronary arteries,normal ejection fraction  . COLONOSCOPY      2004 and Incomplete 04/13/2014  . COLONOSCOPY N/A 10/25/2014   Procedure: COLONOSCOPY;  Surgeon: Malissa HippoNajeeb U Rehman, MD;  [facility];  Service: Endoscopy;  Laterality: N/A;  200 - moved to 1:45 - Ann to notify  . TUBAL LIGATION       No Known Allergies    Family History  Problem Relation Age of Onset  . Stroke Mother   . Heart attack Father   . Stroke Father   . Breast cancer Sister      Social History Ms. Mauri reports that she has never smoked. She does not have any smokeless tobacco history on file. Ms. Unk LightningRierson reports that she drinks alcohol.   Review of Systems CONSTITUTIONAL: No weight loss, fever, chills, weakness or fatigue.  HEENT: Eyes: No visual loss, blurred vision, double vision or yellow sclerae.No hearing loss, sneezing, congestion, runny nose or sore throat.  SKIN: No rash or itching.  CARDIOVASCULAR: per hpi RESPIRATORY: No shortness of breath, cough or sputum.  GASTROINTESTINAL: No anorexia, nausea, vomiting or diarrhea. No abdominal pain or blood.  GENITOURINARY: No burning on urination, no polyuria NEUROLOGICAL: No headache, dizziness, syncope, paralysis, ataxia,  numbness or tingling in the extremities. No change in bowel or bladder control.  MUSCULOSKELETAL: No muscle, back pain, joint pain or stiffness.  LYMPHATICS: No enlarged nodes. No history of splenectomy.  PSYCHIATRIC: No history of depression or anxiety.  ENDOCRINOLOGIC: No reports of sweating, cold or heat intolerance. No polyuria or polydipsia.  Marland Kitchen   Physical Examination Vitals:   11/03/17 1319 11/03/17 1327  BP: 137/86 130/84  Pulse: (!) 58 61  SpO2: 97% 95%   Vitals:   11/03/17 1319  Weight: 131 lb (59.4 kg)  Height: 5\' 3"  (1.6 m)    Gen: resting comfortably, no acute distress HEENT: no scleral icterus, pupils equal round  and reactive, no palptable cervical adenopathy,  CV: RRR, no m/r/g, no jvd Resp: Clear to auscultation bilaterally GI: abdomen is soft, non-tender, non-distended, normal bowel sounds, no hepatosplenomegaly MSK: extremities are warm, no edema.  Skin: warm, no rash Neuro:  no focal deficits Psych: appropriate affect   Diagnostic Studies 01/2001 cath RESULTS:  HEMODYNAMICS:  LV 152/20, AO 153/78.  CORONARIES:  The left main was normal.  Left anterior descending artery:  The LAD was normal.  Circumflex:  The circumflex was normal.  Right coronary artery:  The right coronary artery was nondominant and normal.  LEFT VENTRICULOGRAM:  A left ventriculogram was obtained in the RAO projection.  The EF was 55% with normal wall motion.  There was mild mitral valve prolapse without regurgitation noted.  CONCLUSION: 1. Normal coronary arteries. 2. Well preserved left ventricular function. 3. Mild mitral valve prolapse.    Assessment and Plan  1. Chest pain - 2-3 days of chest pain last month resolved without recurrence, initial workup has been negative for ischemic cause - in absence of recurrent symptoms would not proceed with ischemic testing at this time, continue to monitor   F/u 6 months      Antoine Poche, M.D.

## 2017-11-03 NOTE — Patient Instructions (Signed)

## 2018-06-09 ENCOUNTER — Other Ambulatory Visit: Payer: Self-pay | Admitting: Internal Medicine

## 2018-06-09 DIAGNOSIS — Z1231 Encounter for screening mammogram for malignant neoplasm of breast: Secondary | ICD-10-CM

## 2018-08-04 ENCOUNTER — Other Ambulatory Visit: Payer: Self-pay

## 2018-08-04 ENCOUNTER — Ambulatory Visit
Admission: RE | Admit: 2018-08-04 | Discharge: 2018-08-04 | Disposition: A | Discharge status: Home / Self Care | Payer: Medicare Other | Source: Ambulatory Visit | Attending: Internal Medicine | Admitting: Internal Medicine

## 2018-08-04 DIAGNOSIS — Z1231 Encounter for screening mammogram for malignant neoplasm of breast: Secondary | ICD-10-CM

## 2018-08-05 ENCOUNTER — Other Ambulatory Visit: Payer: Self-pay | Admitting: Internal Medicine

## 2018-08-05 DIAGNOSIS — R928 Other abnormal and inconclusive findings on diagnostic imaging of breast: Secondary | ICD-10-CM

## 2018-08-11 ENCOUNTER — Ambulatory Visit
Admission: RE | Admit: 2018-08-11 | Discharge: 2018-08-11 | Disposition: A | Discharge status: Home / Self Care | Payer: Medicare Other | Source: Ambulatory Visit | Attending: Internal Medicine | Admitting: Internal Medicine

## 2018-08-11 ENCOUNTER — Other Ambulatory Visit: Payer: Self-pay

## 2018-08-11 DIAGNOSIS — R928 Other abnormal and inconclusive findings on diagnostic imaging of breast: Secondary | ICD-10-CM

## 2019-07-29 DIAGNOSIS — Z1211 Encounter for screening for malignant neoplasm of colon: Secondary | ICD-10-CM | POA: Diagnosis not present

## 2019-07-29 DIAGNOSIS — Z7189 Other specified counseling: Secondary | ICD-10-CM | POA: Diagnosis not present

## 2019-07-29 DIAGNOSIS — Z0000 Encounter for general adult medical examination without abnormal findings: Secondary | ICD-10-CM | POA: Diagnosis not present

## 2019-07-29 DIAGNOSIS — Z6821 Body mass index (BMI) 21.0-21.9, adult: Secondary | ICD-10-CM | POA: Diagnosis not present

## 2019-07-29 DIAGNOSIS — Z79899 Other long term (current) drug therapy: Secondary | ICD-10-CM | POA: Diagnosis not present

## 2019-07-29 DIAGNOSIS — R5383 Other fatigue: Secondary | ICD-10-CM | POA: Diagnosis not present

## 2019-07-29 DIAGNOSIS — I10 Essential (primary) hypertension: Secondary | ICD-10-CM | POA: Diagnosis not present

## 2019-07-29 DIAGNOSIS — Z1331 Encounter for screening for depression: Secondary | ICD-10-CM | POA: Diagnosis not present

## 2019-07-29 DIAGNOSIS — E7800 Pure hypercholesterolemia, unspecified: Secondary | ICD-10-CM | POA: Diagnosis not present

## 2019-07-29 DIAGNOSIS — Z299 Encounter for prophylactic measures, unspecified: Secondary | ICD-10-CM | POA: Diagnosis not present

## 2019-07-29 DIAGNOSIS — Z1339 Encounter for screening examination for other mental health and behavioral disorders: Secondary | ICD-10-CM | POA: Diagnosis not present

## 2019-08-10 DIAGNOSIS — E7800 Pure hypercholesterolemia, unspecified: Secondary | ICD-10-CM | POA: Diagnosis not present

## 2019-08-10 DIAGNOSIS — M810 Age-related osteoporosis without current pathological fracture: Secondary | ICD-10-CM | POA: Diagnosis not present

## 2019-09-06 DIAGNOSIS — E2839 Other primary ovarian failure: Secondary | ICD-10-CM | POA: Diagnosis not present

## 2019-09-09 DIAGNOSIS — E7800 Pure hypercholesterolemia, unspecified: Secondary | ICD-10-CM | POA: Diagnosis not present

## 2019-09-09 DIAGNOSIS — M810 Age-related osteoporosis without current pathological fracture: Secondary | ICD-10-CM | POA: Diagnosis not present

## 2019-10-07 ENCOUNTER — Other Ambulatory Visit: Payer: Self-pay | Admitting: Internal Medicine

## 2019-10-07 DIAGNOSIS — Z1231 Encounter for screening mammogram for malignant neoplasm of breast: Secondary | ICD-10-CM

## 2019-10-07 DIAGNOSIS — M810 Age-related osteoporosis without current pathological fracture: Secondary | ICD-10-CM | POA: Diagnosis not present

## 2019-10-07 DIAGNOSIS — E7800 Pure hypercholesterolemia, unspecified: Secondary | ICD-10-CM | POA: Diagnosis not present

## 2019-10-14 ENCOUNTER — Ambulatory Visit: Payer: Medicare Other

## 2019-10-18 DIAGNOSIS — M8580 Other specified disorders of bone density and structure, unspecified site: Secondary | ICD-10-CM | POA: Diagnosis not present

## 2019-10-18 DIAGNOSIS — I10 Essential (primary) hypertension: Secondary | ICD-10-CM | POA: Diagnosis not present

## 2019-10-18 DIAGNOSIS — Z299 Encounter for prophylactic measures, unspecified: Secondary | ICD-10-CM | POA: Diagnosis not present

## 2019-10-21 ENCOUNTER — Ambulatory Visit: Payer: Medicare PPO

## 2019-10-26 ENCOUNTER — Other Ambulatory Visit: Payer: Self-pay

## 2019-10-26 ENCOUNTER — Ambulatory Visit
Admission: RE | Admit: 2019-10-26 | Discharge: 2019-10-26 | Disposition: A | Discharge status: Home / Self Care | Payer: Medicare PPO | Source: Ambulatory Visit | Attending: Internal Medicine | Admitting: Internal Medicine

## 2019-10-26 DIAGNOSIS — Z1231 Encounter for screening mammogram for malignant neoplasm of breast: Secondary | ICD-10-CM | POA: Diagnosis not present

## 2019-11-10 DIAGNOSIS — E7800 Pure hypercholesterolemia, unspecified: Secondary | ICD-10-CM | POA: Diagnosis not present

## 2019-11-10 DIAGNOSIS — M810 Age-related osteoporosis without current pathological fracture: Secondary | ICD-10-CM | POA: Diagnosis not present

## 2019-11-17 DIAGNOSIS — H26491 Other secondary cataract, right eye: Secondary | ICD-10-CM | POA: Diagnosis not present

## 2019-11-17 DIAGNOSIS — H0288B Meibomian gland dysfunction left eye, upper and lower eyelids: Secondary | ICD-10-CM | POA: Diagnosis not present

## 2019-11-17 DIAGNOSIS — Z961 Presence of intraocular lens: Secondary | ICD-10-CM | POA: Diagnosis not present

## 2019-11-17 DIAGNOSIS — H00014 Hordeolum externum left upper eyelid: Secondary | ICD-10-CM | POA: Diagnosis not present

## 2019-11-17 DIAGNOSIS — H2512 Age-related nuclear cataract, left eye: Secondary | ICD-10-CM | POA: Diagnosis not present

## 2019-12-09 DIAGNOSIS — E7800 Pure hypercholesterolemia, unspecified: Secondary | ICD-10-CM | POA: Diagnosis not present

## 2019-12-09 DIAGNOSIS — M810 Age-related osteoporosis without current pathological fracture: Secondary | ICD-10-CM | POA: Diagnosis not present

## 2020-01-10 DIAGNOSIS — E7800 Pure hypercholesterolemia, unspecified: Secondary | ICD-10-CM | POA: Diagnosis not present

## 2020-01-10 DIAGNOSIS — M810 Age-related osteoporosis without current pathological fracture: Secondary | ICD-10-CM | POA: Diagnosis not present

## 2020-02-09 DIAGNOSIS — E7800 Pure hypercholesterolemia, unspecified: Secondary | ICD-10-CM | POA: Diagnosis not present

## 2020-02-09 DIAGNOSIS — M810 Age-related osteoporosis without current pathological fracture: Secondary | ICD-10-CM | POA: Diagnosis not present

## 2020-06-06 DIAGNOSIS — J329 Chronic sinusitis, unspecified: Secondary | ICD-10-CM | POA: Diagnosis not present

## 2020-06-06 DIAGNOSIS — R059 Cough, unspecified: Secondary | ICD-10-CM | POA: Diagnosis not present

## 2020-06-06 DIAGNOSIS — Z299 Encounter for prophylactic measures, unspecified: Secondary | ICD-10-CM | POA: Diagnosis not present

## 2020-06-06 DIAGNOSIS — I10 Essential (primary) hypertension: Secondary | ICD-10-CM | POA: Diagnosis not present

## 2020-06-22 DIAGNOSIS — R0602 Shortness of breath: Secondary | ICD-10-CM | POA: Diagnosis not present

## 2020-06-22 DIAGNOSIS — D3500 Benign neoplasm of unspecified adrenal gland: Secondary | ICD-10-CM | POA: Diagnosis not present

## 2020-06-22 DIAGNOSIS — I10 Essential (primary) hypertension: Secondary | ICD-10-CM | POA: Diagnosis not present

## 2020-06-22 DIAGNOSIS — R112 Nausea with vomiting, unspecified: Secondary | ICD-10-CM | POA: Diagnosis not present

## 2020-06-22 DIAGNOSIS — I4892 Unspecified atrial flutter: Secondary | ICD-10-CM | POA: Diagnosis not present

## 2020-06-22 DIAGNOSIS — I2699 Other pulmonary embolism without acute cor pulmonale: Secondary | ICD-10-CM | POA: Diagnosis not present

## 2020-06-22 DIAGNOSIS — R000 Tachycardia, unspecified: Secondary | ICD-10-CM | POA: Diagnosis not present

## 2020-06-22 DIAGNOSIS — Z20822 Contact with and (suspected) exposure to covid-19: Secondary | ICD-10-CM | POA: Diagnosis not present

## 2020-06-22 DIAGNOSIS — I959 Hypotension, unspecified: Secondary | ICD-10-CM | POA: Diagnosis not present

## 2020-06-22 DIAGNOSIS — E7800 Pure hypercholesterolemia, unspecified: Secondary | ICD-10-CM | POA: Diagnosis not present

## 2020-06-22 DIAGNOSIS — K529 Noninfective gastroenteritis and colitis, unspecified: Secondary | ICD-10-CM | POA: Diagnosis not present

## 2020-06-22 DIAGNOSIS — R1032 Left lower quadrant pain: Secondary | ICD-10-CM | POA: Diagnosis not present

## 2020-06-22 DIAGNOSIS — I700 Atherosclerosis of aorta: Secondary | ICD-10-CM | POA: Diagnosis not present

## 2020-06-22 DIAGNOSIS — R1110 Vomiting, unspecified: Secondary | ICD-10-CM | POA: Diagnosis not present

## 2020-06-22 DIAGNOSIS — R079 Chest pain, unspecified: Secondary | ICD-10-CM | POA: Diagnosis not present

## 2020-06-22 DIAGNOSIS — K5750 Diverticulosis of both small and large intestine without perforation or abscess without bleeding: Secondary | ICD-10-CM | POA: Diagnosis not present

## 2020-06-23 DIAGNOSIS — R112 Nausea with vomiting, unspecified: Secondary | ICD-10-CM | POA: Diagnosis not present

## 2020-06-26 DIAGNOSIS — Z09 Encounter for follow-up examination after completed treatment for conditions other than malignant neoplasm: Secondary | ICD-10-CM | POA: Diagnosis not present

## 2020-06-26 DIAGNOSIS — Z299 Encounter for prophylactic measures, unspecified: Secondary | ICD-10-CM | POA: Diagnosis not present

## 2020-06-26 DIAGNOSIS — I10 Essential (primary) hypertension: Secondary | ICD-10-CM | POA: Diagnosis not present

## 2020-06-26 DIAGNOSIS — R110 Nausea: Secondary | ICD-10-CM | POA: Diagnosis not present

## 2020-06-26 DIAGNOSIS — K529 Noninfective gastroenteritis and colitis, unspecified: Secondary | ICD-10-CM | POA: Diagnosis not present

## 2020-08-02 DIAGNOSIS — Z01419 Encounter for gynecological examination (general) (routine) without abnormal findings: Secondary | ICD-10-CM | POA: Diagnosis not present

## 2020-08-02 DIAGNOSIS — N83201 Unspecified ovarian cyst, right side: Secondary | ICD-10-CM | POA: Diagnosis not present

## 2020-08-02 DIAGNOSIS — Z01411 Encounter for gynecological examination (general) (routine) with abnormal findings: Secondary | ICD-10-CM | POA: Diagnosis not present

## 2020-08-02 DIAGNOSIS — Z6822 Body mass index (BMI) 22.0-22.9, adult: Secondary | ICD-10-CM | POA: Diagnosis not present

## 2020-08-02 DIAGNOSIS — Z124 Encounter for screening for malignant neoplasm of cervix: Secondary | ICD-10-CM | POA: Diagnosis not present

## 2020-08-09 ENCOUNTER — Encounter: Payer: Self-pay | Admitting: Cardiology

## 2020-08-09 DIAGNOSIS — Z7189 Other specified counseling: Secondary | ICD-10-CM | POA: Diagnosis not present

## 2020-08-09 DIAGNOSIS — Z6821 Body mass index (BMI) 21.0-21.9, adult: Secondary | ICD-10-CM | POA: Diagnosis not present

## 2020-08-09 DIAGNOSIS — I10 Essential (primary) hypertension: Secondary | ICD-10-CM | POA: Diagnosis not present

## 2020-08-09 DIAGNOSIS — E7800 Pure hypercholesterolemia, unspecified: Secondary | ICD-10-CM | POA: Diagnosis not present

## 2020-08-09 DIAGNOSIS — Z1339 Encounter for screening examination for other mental health and behavioral disorders: Secondary | ICD-10-CM | POA: Diagnosis not present

## 2020-08-09 DIAGNOSIS — Z0000 Encounter for general adult medical examination without abnormal findings: Secondary | ICD-10-CM | POA: Diagnosis not present

## 2020-08-09 DIAGNOSIS — Z1331 Encounter for screening for depression: Secondary | ICD-10-CM | POA: Diagnosis not present

## 2020-08-09 DIAGNOSIS — Z299 Encounter for prophylactic measures, unspecified: Secondary | ICD-10-CM | POA: Diagnosis not present

## 2020-08-09 DIAGNOSIS — Z79899 Other long term (current) drug therapy: Secondary | ICD-10-CM | POA: Diagnosis not present

## 2020-08-09 DIAGNOSIS — R5383 Other fatigue: Secondary | ICD-10-CM | POA: Diagnosis not present

## 2020-08-24 DIAGNOSIS — H524 Presbyopia: Secondary | ICD-10-CM | POA: Diagnosis not present

## 2020-08-24 DIAGNOSIS — H43811 Vitreous degeneration, right eye: Secondary | ICD-10-CM | POA: Diagnosis not present

## 2020-09-21 ENCOUNTER — Other Ambulatory Visit: Payer: Self-pay | Admitting: Internal Medicine

## 2020-09-21 DIAGNOSIS — Z1231 Encounter for screening mammogram for malignant neoplasm of breast: Secondary | ICD-10-CM

## 2020-11-01 DIAGNOSIS — L720 Epidermal cyst: Secondary | ICD-10-CM | POA: Diagnosis not present

## 2020-11-01 DIAGNOSIS — D485 Neoplasm of uncertain behavior of skin: Secondary | ICD-10-CM | POA: Diagnosis not present

## 2020-11-01 DIAGNOSIS — L814 Other melanin hyperpigmentation: Secondary | ICD-10-CM | POA: Diagnosis not present

## 2020-11-01 DIAGNOSIS — D0461 Carcinoma in situ of skin of right upper limb, including shoulder: Secondary | ICD-10-CM | POA: Diagnosis not present

## 2020-11-01 DIAGNOSIS — I788 Other diseases of capillaries: Secondary | ICD-10-CM | POA: Diagnosis not present

## 2020-11-01 DIAGNOSIS — L570 Actinic keratosis: Secondary | ICD-10-CM | POA: Diagnosis not present

## 2020-11-01 DIAGNOSIS — L821 Other seborrheic keratosis: Secondary | ICD-10-CM | POA: Diagnosis not present

## 2020-11-08 ENCOUNTER — Other Ambulatory Visit: Payer: Self-pay

## 2020-11-08 ENCOUNTER — Ambulatory Visit
Admission: RE | Admit: 2020-11-08 | Discharge: 2020-11-08 | Disposition: A | Discharge status: Home / Self Care | Payer: Medicare PPO | Source: Ambulatory Visit | Attending: Internal Medicine | Admitting: Internal Medicine

## 2020-11-08 DIAGNOSIS — Z1231 Encounter for screening mammogram for malignant neoplasm of breast: Secondary | ICD-10-CM | POA: Diagnosis not present

## 2021-02-13 DIAGNOSIS — D0461 Carcinoma in situ of skin of right upper limb, including shoulder: Secondary | ICD-10-CM | POA: Diagnosis not present

## 2021-02-13 DIAGNOSIS — Z85828 Personal history of other malignant neoplasm of skin: Secondary | ICD-10-CM | POA: Diagnosis not present

## 2021-03-28 DIAGNOSIS — F3342 Major depressive disorder, recurrent, in full remission: Secondary | ICD-10-CM | POA: Diagnosis not present

## 2021-03-28 DIAGNOSIS — D692 Other nonthrombocytopenic purpura: Secondary | ICD-10-CM | POA: Diagnosis not present

## 2021-03-28 DIAGNOSIS — I10 Essential (primary) hypertension: Secondary | ICD-10-CM | POA: Diagnosis not present

## 2021-03-28 DIAGNOSIS — K649 Unspecified hemorrhoids: Secondary | ICD-10-CM | POA: Diagnosis not present

## 2021-03-28 DIAGNOSIS — Z299 Encounter for prophylactic measures, unspecified: Secondary | ICD-10-CM | POA: Diagnosis not present

## 2021-04-10 DIAGNOSIS — R109 Unspecified abdominal pain: Secondary | ICD-10-CM | POA: Diagnosis not present

## 2021-04-10 DIAGNOSIS — Z299 Encounter for prophylactic measures, unspecified: Secondary | ICD-10-CM | POA: Diagnosis not present

## 2021-04-10 DIAGNOSIS — Z789 Other specified health status: Secondary | ICD-10-CM | POA: Diagnosis not present

## 2021-04-10 DIAGNOSIS — I10 Essential (primary) hypertension: Secondary | ICD-10-CM | POA: Diagnosis not present

## 2021-04-16 DIAGNOSIS — I700 Atherosclerosis of aorta: Secondary | ICD-10-CM | POA: Diagnosis not present

## 2021-04-16 DIAGNOSIS — R109 Unspecified abdominal pain: Secondary | ICD-10-CM | POA: Diagnosis not present

## 2021-04-16 DIAGNOSIS — R1032 Left lower quadrant pain: Secondary | ICD-10-CM | POA: Diagnosis not present

## 2021-04-16 DIAGNOSIS — K5730 Diverticulosis of large intestine without perforation or abscess without bleeding: Secondary | ICD-10-CM | POA: Diagnosis not present

## 2021-04-16 DIAGNOSIS — R1012 Left upper quadrant pain: Secondary | ICD-10-CM | POA: Diagnosis not present

## 2021-04-18 DIAGNOSIS — Z789 Other specified health status: Secondary | ICD-10-CM | POA: Diagnosis not present

## 2021-04-18 DIAGNOSIS — I517 Cardiomegaly: Secondary | ICD-10-CM | POA: Diagnosis not present

## 2021-04-18 DIAGNOSIS — I10 Essential (primary) hypertension: Secondary | ICD-10-CM | POA: Diagnosis not present

## 2021-04-18 DIAGNOSIS — Z299 Encounter for prophylactic measures, unspecified: Secondary | ICD-10-CM | POA: Diagnosis not present

## 2021-04-18 DIAGNOSIS — I700 Atherosclerosis of aorta: Secondary | ICD-10-CM | POA: Diagnosis not present

## 2021-04-18 DIAGNOSIS — D692 Other nonthrombocytopenic purpura: Secondary | ICD-10-CM | POA: Diagnosis not present

## 2021-05-30 ENCOUNTER — Encounter: Payer: Self-pay | Admitting: *Deleted

## 2021-05-30 ENCOUNTER — Encounter: Payer: Self-pay | Admitting: Cardiology

## 2021-05-30 ENCOUNTER — Ambulatory Visit: Payer: Medicare PPO | Admitting: Cardiology

## 2021-05-30 VITALS — BP 120/76 | HR 63 | Ht 63.0 in | Wt 131.0 lb

## 2021-05-30 DIAGNOSIS — I700 Atherosclerosis of aorta: Secondary | ICD-10-CM | POA: Diagnosis not present

## 2021-05-30 DIAGNOSIS — R0609 Other forms of dyspnea: Secondary | ICD-10-CM | POA: Diagnosis not present

## 2021-05-30 DIAGNOSIS — E782 Mixed hyperlipidemia: Secondary | ICD-10-CM | POA: Diagnosis not present

## 2021-05-30 DIAGNOSIS — R079 Chest pain, unspecified: Secondary | ICD-10-CM | POA: Diagnosis not present

## 2021-05-30 DIAGNOSIS — I517 Cardiomegaly: Secondary | ICD-10-CM

## 2021-05-30 NOTE — Progress Notes (Signed)
? ? ?Cardiology Office Note ? ?Date: 05/30/2021  ? ?ID: Sheila Martin, DOB December 13, 1950, MRN [id number] ? ?PCP:  Glenda Chroman, MD  ?Cardiologist:  Rozann Lesches, MD ?Electrophysiologist:  None  ? ?Chief Complaint  ?Patient presents with  ? Chest pain, atherosclerosis by CT imaging  ? ? ?History of Present Illness: ?Sheila Martin is a 71 y.o. female former patient of Dr. Harl Bowie now presenting to establish follow-up with me.  She is referred back to the office by Ms. Hairfield NP in light of recent abdominal and pelvic CT imaging describing aortic atherosclerosis and also cardiomegaly.  I reviewed her records and updated the chart.  She has not been seen since 2019.  She also reports a feeling of intermittent palpitations described as a "skips", also recurring sharp chest discomfort and dyspnea on exertion particular with inclines.  She otherwise does not feel any major functional limitations.  Symptoms been present for several months. ? ?She did undergo cardiac catheterization back in 2002 demonstrating normal coronary arteries.  No follow-up ischemic testing more recently.  I personally reviewed her ECG today which shows sinus rhythm with left anterior fascicular block, decreased R wave progression. ? ?She has been on statin therapy at baseline, also Lopressor for control of blood pressure.  Requesting recent lipid panel from PCP. ? ?She is retired Patent examiner, taught math and social studies.  Has a part-time job now. ? ?Past Medical History:  ?Diagnosis Date  ? Actinic keratosis   ? Atherosclerosis of aorta ([place])   ? History of cardiac catheterization   ? Normal coronaries 2002  ? History of migraine   ? Mixed hyperlipidemia   ? Osteoarthritis   ? Seasonal allergies   ? ? ?Past Surgical History:  ?Procedure Laterality Date  ? CARDIAC CATHETERIZATION  01/2001  ? [facility]/Normal coronary arteries,normal ejection fraction  ? COLONOSCOPY    ?  2004 and Incomplete 04/13/2014  ? COLONOSCOPY N/A 10/25/2014  ?  Procedure: COLONOSCOPY;  Surgeon: Rogene Houston, MD;  Location[facility];  Service: Endoscopy;  Laterality: N/A;  200 - moved to 1:45 - Ann to notify  ? TUBAL LIGATION    ? ? ?Current Outpatient Medications  ?Medication Sig Dispense Refill  ? aspirin EC 81 MG tablet Take 81 mg by mouth daily.    ? Calcium Carbonate (CALTRATE 600 PO) Take 1 tablet by mouth daily.     ? Cholecalciferol (VITAMIN D) 2000 units CAPS Take 1 capsule by mouth daily.    ? citalopram (CELEXA) 20 MG tablet Take 20 mg by mouth at bedtime.  4  ? Coenzyme Q10 (COQ10) 200 MG CAPS Take 1 capsule by mouth daily.    ? Cyanocobalamin (B-12 PO) Take 1 tablet by mouth daily.    ? metoprolol tartrate (LOPRESSOR) 50 MG tablet Take 0.5 tablets by mouth 3 (three) times daily as needed.  11  ? simvastatin (ZOCOR) 20 MG tablet Take 20 mg by mouth daily.  4  ? ?No current facility-administered medications for this visit.  ? ?Allergies:  Sulfa antibiotics  ? ?Social History: The patient  reports that she has never smoked. She has never used smokeless tobacco. She reports current alcohol use. She reports that she does not use drugs.  ? ?Family History: The patient's family history includes Breast cancer in her sister; Heart attack in her father; Stroke in her father and mother.  ? ?ROS: No sudden syncope. ? ?Physical Exam: ?VS:  BP 120/76   Pulse  63   Ht 5\' 3"  (1.6 m)   Wt 131 lb (59.4 kg)   SpO2 93%   BMI 23.21 kg/m? , BMI Body mass index is 23.21 kg/m?. ? ?Wt Readings from Last 3 Encounters:  ?05/30/21 131 lb (59.4 kg)  ?11/03/17 131 lb (59.4 kg)  ?10/25/14 130 lb (59 kg)  ?  ?General: Patient appears comfortable at rest. ?HEENT: Conjunctiva and lids normal, oropharynx clear. ?Neck: Supple, no elevated JVP or carotid bruits, no thyromegaly. ?Lungs: Clear to auscultation, nonlabored breathing at rest. ?Cardiac: Regular rate and rhythm, no S3 or significant systolic murmur, no pericardial rub. ?Abdomen: Soft, nontender, bowel sounds  present. ?Extremities: No pitting edema, distal pulses 2+. ?Skin: Warm and dry. ?Musculoskeletal: No kyphosis. ?Neuropsychiatric: Alert and oriented x3, affect grossly appropriate. ? ?ECG:  An ECG dated 09/20/2017 was personally reviewed today and demonstrated:  Sinus rhythm with left atrial enlargement and left anterior fascicular block. ? ?Recent Labwork: ? ?March 2022: High-sensitivity troponin I 110, hemoglobin 12.1, platelets 147, potassium 3.6, BUN 18, creatinine 0.75 ?March 2023: Potassium 4.9, BUN 26, creatinine 1.02, AST 24, ALT 29 ? ?Other Studies Reviewed Today: ? ?Echocardiogram 10/05/2017 [facility]): ?Normal LV wall thickness and chamber size with LVEF 70 to 75%, normal RV contraction, normal left and right atrial chamber size, mildly thickened aortic valve without stenosis or regurgitation, mildly thickened mitral valve with trace mitral regurgitation, mild mitral regurgitation, normal estimated RVSP, no pericardial effusion. ? ?CT abdomen and pelvis March 2023 [facility]): ?FINDINGS:  ?Lower chest: Mild dependent atelectasis is present. Pectus excavatum  ?noted. Heart is enlarged. No edema or effusion is present.  ? ?Hepatobiliary: No focal liver abnormality is seen. No gallstones,  ?gallbladder wall thickening, or biliary dilatation.  ? ?Pancreas: Unremarkable. No pancreatic ductal dilatation or  ?surrounding inflammatory changes.  ? ?Spleen: Normal in size without focal abnormality.  ? ?Adrenals/Urinary Tract: Adrenal glands are normal bilaterally.  ?Kidneys are unremarkable. No stone or mass lesion is present.  ?Ureters are within normal limits bilaterally. No obstruction is  ?present. The urinary bladder is normal.  ? ?Stomach/Bowel: Stomach and duodenum within normal limits. Small  ?bowel is unremarkable. Terminal ileum is within normal limits. The  ?appendix is visualized and normal. The ascending and transverse  ?colon are within normal limits. Descending colon is unremarkable.   ?Diverticular changes are present throughout the sigmoid colon. No  ?focal inflammation is present to suggest diverticulitis.  ? ?Vascular/Lymphatic: Atherosclerotic calcifications are present  ?within the aorta and branch vessels without aneurysm. No significant  ?adenopathy is present.  ? ?Reproductive: Uterus and bilateral adnexa are unremarkable.  ? ?Other: No abdominal wall hernia or abnormality. No abdominopelvic  ?ascites.  ? ?Musculoskeletal: Degenerative endplate changes are again noted at  ?L2-3 and L3-4 multilevel facet degenerative changes are present. No  ?focal osseous lesions are present. Bony pelvis is within normal  ?limits. The hips are located and normal.  ? ?IMPRESSION:  ?1. Sigmoid diverticulosis without diverticulitis.  ?2. No acute or focal lesion to explain the patient's abdominal pain.  ?3. Cardiomegaly without failure.  ?4. Aortic Atherosclerosis (ICD10-I70.0).   ? ?Assessment and Plan: ? ?1.  Recurrent atypical chest pain and dyspnea on exertion with inclines in a 71 year old woman with hypertension, hyperlipidemia, and aortic atherosclerosis evident by CT imaging.  ECG is nonspecific.  She does not report any ischemic testing since undergoing a cardiac catheterization in 2002 that was reassuring.  Plan is to proceed with a coronary CTA for further evaluation. ? ?  2.  Cardiomegaly based on CT imaging from March.  She has not undergone an echocardiogram since 2019.  With baseline hypertension history, would repeat echocardiogram to ensure normal LVEF and no LV chamber dilatation. ? ?3.  Hyperlipidemia, on Zocor.  Requesting lipid panel from PCP. ? ?Medication Adjustments/Labs and Tests Ordered: ?Current medicines are reviewed at length with the patient today.  Concerns regarding medicines are outlined above.  ? ?Tests Ordered: ?Orders Placed This Encounter  ?Procedures  ? CT CORONARY MORPH W/CTA COR W/SCORE W/CA W/CM &/OR WO/CM  ? Basic metabolic panel  ? EKG 12-Lead  ? ECHOCARDIOGRAM  COMPLETE  ? ? ?Medication Changes: ?No orders of the defined types were placed in this encounter. ? ? ?Disposition:  Follow up  test results. ? ?Signed, ?Satira Sark, MD, [facility] ?05/30/2021 3:25 PM    ?[place]

## 2021-05-30 NOTE — Patient Instructions (Addendum)
Medication Instructions:  ?Your physician recommends that you continue on your current medications as directed. Please refer to the Current Medication list given to you today. ? ?Labwork: ?BMET in 2 weeks ?Non-fasting ?[facility] ? ?Testing/Procedures: ?Your physician has requested that you have an echocardiogram. Echocardiography is a painless test that uses sound waves to create images of your heart. It provides your doctor with information about the size and shape of your heart and how well your heart?s chambers and valves are working. This procedure takes approximately one hour. There are no restrictions for this procedure. ?Coronary CTA ? ?Follow-Up: ?Your physician recommends that you schedule a follow-up appointment in: pending ? ?Any Other Special Instructions Will Be Listed Below (If Applicable). ? ?If you need a refill on your cardiac medications before your next appointment, please call your pharmacy. ? ? ? ?Your cardiac CT will be scheduled at one of the below locations:  ? ?[facility] ?[address] ?[place], [place] [address] ?[phone] ? ?If scheduled at [facility], please arrive at the [facility] and Children's Entrance (Entrance C2) of [facility] 30 minutes prior to test start time. ?You can use the FREE valet parking offered at entrance C (encouraged to control the heart rate for the test)  ?Proceed to the [facility] Radiology Department (first floor) to check-in and test prep. ? ?All radiology patients and guests should use entrance C2 at [facility], accessed from [facility], even though the hospital's physical address listed is [address]. ? ? ? ? ?Please follow these instructions carefully (unless otherwise directed): ? ?On the Night Before the Test: ?Be sure to Drink plenty of water. ?Do not consume any caffeinated/decaffeinated beverages or chocolate 12 hours prior to your test. ?Do not take any antihistamines 12 hours  prior to your test. ?If the patient has contrast allergy: ? ?On the Day of the Test: ?Drink plenty of water until 1 hour prior to the test. ?Do not eat any food 4 hours prior to the test. ?You may take your regular medications prior to the test.  ?Take metoprolol (Lopressor) 50 mg two hours prior to test. ?FEMALES- please wear underwire-free bra if available, avoid dresses & tight clothing ? ?     ?After the Test: ?Drink plenty of water. ?After receiving IV contrast, you may experience a mild flushed feeling. This is normal. ?On occasion, you may experience a mild rash up to 24 hours after the test. This is not dangerous. If this occurs, you can take Benadryl 25 mg and increase your fluid intake. ?If you experience trouble breathing, this can be serious. If it is severe call 911 IMMEDIATELY. If it is mild, please call our office. ? ?We will call to schedule your test 2-4 weeks out understanding that some insurance companies will need an authorization prior to the service being performed.  ? ?For non-scheduling related questions, please contact the cardiac imaging nurse navigator should you have any questions/concerns: ?Rockwell Alexandria, Cardiac Imaging Nurse Navigator ?Larey Brick, Cardiac Imaging Nurse Navigator ?[facility] ?Direct Office Dial: [phone]  ? ?For scheduling needs, including cancellations and rescheduling, please call [place], [phone]. ?

## 2021-06-04 DIAGNOSIS — R069 Unspecified abnormalities of breathing: Secondary | ICD-10-CM | POA: Diagnosis not present

## 2021-06-11 ENCOUNTER — Telehealth (HOSPITAL_COMMUNITY): Payer: Self-pay | Admitting: Emergency Medicine

## 2021-06-11 NOTE — Telephone Encounter (Signed)
Attempted to call patient regarding upcoming cardiac CT appointment. °Left message on voicemail with name and callback number °Ranika Mcniel RN Navigator Cardiac Imaging °[facility] °[phone] Office °[phone] Cell ° °

## 2021-06-12 ENCOUNTER — Ambulatory Visit (HOSPITAL_COMMUNITY)
Admission: RE | Admit: 2021-06-12 | Discharge: 2021-06-12 | Disposition: A | Discharge status: Home / Self Care | Payer: Medicare PPO | Source: Ambulatory Visit | Attending: Cardiology | Admitting: Cardiology

## 2021-06-12 DIAGNOSIS — I119 Hypertensive heart disease without heart failure: Secondary | ICD-10-CM | POA: Insufficient documentation

## 2021-06-12 DIAGNOSIS — R0609 Other forms of dyspnea: Secondary | ICD-10-CM | POA: Insufficient documentation

## 2021-06-12 DIAGNOSIS — I700 Atherosclerosis of aorta: Secondary | ICD-10-CM | POA: Diagnosis not present

## 2021-06-12 DIAGNOSIS — E785 Hyperlipidemia, unspecified: Secondary | ICD-10-CM | POA: Diagnosis not present

## 2021-06-12 DIAGNOSIS — R079 Chest pain, unspecified: Secondary | ICD-10-CM | POA: Insufficient documentation

## 2021-06-12 MED ORDER — NITROGLYCERIN 0.4 MG SL SUBL
0.8000 mg | SUBLINGUAL_TABLET | Freq: Once | SUBLINGUAL | Status: AC
  Administered 2021-06-12: 0.8 mg via SUBLINGUAL

## 2021-06-12 MED ORDER — IOHEXOL 350 MG/ML SOLN
100.0000 mL | Freq: Once | INTRAVENOUS | Status: AC | PRN
  Administered 2021-06-12: 100 mL via INTRAVENOUS

## 2021-06-12 MED ORDER — NITROGLYCERIN 0.4 MG SL SUBL
SUBLINGUAL_TABLET | SUBLINGUAL | Status: AC
  Filled 2021-06-12: qty 2

## 2021-06-25 ENCOUNTER — Ambulatory Visit (INDEPENDENT_AMBULATORY_CARE_PROVIDER_SITE_OTHER): Payer: Medicare PPO

## 2021-06-25 DIAGNOSIS — I517 Cardiomegaly: Secondary | ICD-10-CM

## 2021-06-25 DIAGNOSIS — R0609 Other forms of dyspnea: Secondary | ICD-10-CM | POA: Diagnosis not present

## 2021-06-25 LAB — ECHOCARDIOGRAM COMPLETE
Calc EF: 67.9 %
S' Lateral: 2.5 cm
Single Plane A2C EF: 65.8 %
Single Plane A4C EF: 68.9 %

## 2021-07-04 ENCOUNTER — Other Ambulatory Visit: Payer: Medicare PPO

## 2021-08-12 DIAGNOSIS — Z1331 Encounter for screening for depression: Secondary | ICD-10-CM | POA: Diagnosis not present

## 2021-08-12 DIAGNOSIS — I700 Atherosclerosis of aorta: Secondary | ICD-10-CM | POA: Diagnosis not present

## 2021-08-12 DIAGNOSIS — Z299 Encounter for prophylactic measures, unspecified: Secondary | ICD-10-CM | POA: Diagnosis not present

## 2021-08-12 DIAGNOSIS — R5383 Other fatigue: Secondary | ICD-10-CM | POA: Diagnosis not present

## 2021-08-12 DIAGNOSIS — Z6821 Body mass index (BMI) 21.0-21.9, adult: Secondary | ICD-10-CM | POA: Diagnosis not present

## 2021-08-12 DIAGNOSIS — Z79899 Other long term (current) drug therapy: Secondary | ICD-10-CM | POA: Diagnosis not present

## 2021-08-12 DIAGNOSIS — E7800 Pure hypercholesterolemia, unspecified: Secondary | ICD-10-CM | POA: Diagnosis not present

## 2021-08-12 DIAGNOSIS — Z1339 Encounter for screening examination for other mental health and behavioral disorders: Secondary | ICD-10-CM | POA: Diagnosis not present

## 2021-08-12 DIAGNOSIS — Z0000 Encounter for general adult medical examination without abnormal findings: Secondary | ICD-10-CM | POA: Diagnosis not present

## 2021-08-12 DIAGNOSIS — I10 Essential (primary) hypertension: Secondary | ICD-10-CM | POA: Diagnosis not present

## 2021-08-12 DIAGNOSIS — Z7189 Other specified counseling: Secondary | ICD-10-CM | POA: Diagnosis not present

## 2021-08-26 DIAGNOSIS — H43812 Vitreous degeneration, left eye: Secondary | ICD-10-CM | POA: Diagnosis not present

## 2021-09-05 DIAGNOSIS — Z0142 Encounter for cervical smear to confirm findings of recent normal smear following initial abnormal smear: Secondary | ICD-10-CM | POA: Diagnosis not present

## 2021-09-05 DIAGNOSIS — Z01419 Encounter for gynecological examination (general) (routine) without abnormal findings: Secondary | ICD-10-CM | POA: Diagnosis not present

## 2021-09-05 DIAGNOSIS — Z01411 Encounter for gynecological examination (general) (routine) with abnormal findings: Secondary | ICD-10-CM | POA: Diagnosis not present

## 2021-09-05 DIAGNOSIS — Z124 Encounter for screening for malignant neoplasm of cervix: Secondary | ICD-10-CM | POA: Diagnosis not present

## 2021-09-17 DIAGNOSIS — E2839 Other primary ovarian failure: Secondary | ICD-10-CM | POA: Diagnosis not present

## 2021-10-09 ENCOUNTER — Other Ambulatory Visit: Payer: Self-pay | Admitting: Internal Medicine

## 2021-10-09 DIAGNOSIS — Z1231 Encounter for screening mammogram for malignant neoplasm of breast: Secondary | ICD-10-CM

## 2021-11-11 ENCOUNTER — Ambulatory Visit: Payer: Medicare PPO

## 2021-11-29 ENCOUNTER — Ambulatory Visit
Admission: RE | Admit: 2021-11-29 | Discharge: 2021-11-29 | Disposition: A | Discharge status: Home / Self Care | Payer: Medicare PPO | Source: Ambulatory Visit | Attending: Internal Medicine | Admitting: Internal Medicine

## 2021-11-29 DIAGNOSIS — Z1231 Encounter for screening mammogram for malignant neoplasm of breast: Secondary | ICD-10-CM | POA: Diagnosis not present

## 2022-03-26 DIAGNOSIS — R300 Dysuria: Secondary | ICD-10-CM | POA: Diagnosis not present

## 2022-03-26 DIAGNOSIS — B3731 Acute candidiasis of vulva and vagina: Secondary | ICD-10-CM | POA: Diagnosis not present

## 2022-03-26 DIAGNOSIS — R102 Pelvic and perineal pain: Secondary | ICD-10-CM | POA: Diagnosis not present

## 2022-03-26 DIAGNOSIS — L292 Pruritus vulvae: Secondary | ICD-10-CM | POA: Diagnosis not present

## 2022-04-09 DIAGNOSIS — L292 Pruritus vulvae: Secondary | ICD-10-CM | POA: Diagnosis not present

## 2022-04-09 DIAGNOSIS — N9089 Other specified noninflammatory disorders of vulva and perineum: Secondary | ICD-10-CM | POA: Diagnosis not present

## 2022-05-15 DIAGNOSIS — L814 Other melanin hyperpigmentation: Secondary | ICD-10-CM | POA: Diagnosis not present

## 2022-05-15 DIAGNOSIS — L821 Other seborrheic keratosis: Secondary | ICD-10-CM | POA: Diagnosis not present

## 2022-05-15 DIAGNOSIS — L570 Actinic keratosis: Secondary | ICD-10-CM | POA: Diagnosis not present

## 2022-05-15 DIAGNOSIS — L720 Epidermal cyst: Secondary | ICD-10-CM | POA: Diagnosis not present

## 2022-07-10 DIAGNOSIS — N9089 Other specified noninflammatory disorders of vulva and perineum: Secondary | ICD-10-CM | POA: Diagnosis not present

## 2022-08-19 DIAGNOSIS — D692 Other nonthrombocytopenic purpura: Secondary | ICD-10-CM | POA: Diagnosis not present

## 2022-08-19 DIAGNOSIS — Z1339 Encounter for screening examination for other mental health and behavioral disorders: Secondary | ICD-10-CM | POA: Diagnosis not present

## 2022-08-19 DIAGNOSIS — Z0000 Encounter for general adult medical examination without abnormal findings: Secondary | ICD-10-CM | POA: Diagnosis not present

## 2022-08-19 DIAGNOSIS — Z299 Encounter for prophylactic measures, unspecified: Secondary | ICD-10-CM | POA: Diagnosis not present

## 2022-08-19 DIAGNOSIS — R5383 Other fatigue: Secondary | ICD-10-CM | POA: Diagnosis not present

## 2022-08-19 DIAGNOSIS — Z1331 Encounter for screening for depression: Secondary | ICD-10-CM | POA: Diagnosis not present

## 2022-08-19 DIAGNOSIS — E7800 Pure hypercholesterolemia, unspecified: Secondary | ICD-10-CM | POA: Diagnosis not present

## 2022-08-19 DIAGNOSIS — I10 Essential (primary) hypertension: Secondary | ICD-10-CM | POA: Diagnosis not present

## 2022-08-19 DIAGNOSIS — Z79899 Other long term (current) drug therapy: Secondary | ICD-10-CM | POA: Diagnosis not present

## 2022-08-19 DIAGNOSIS — Z7189 Other specified counseling: Secondary | ICD-10-CM | POA: Diagnosis not present

## 2022-08-27 DIAGNOSIS — H43811 Vitreous degeneration, right eye: Secondary | ICD-10-CM | POA: Diagnosis not present

## 2022-11-12 ENCOUNTER — Other Ambulatory Visit: Payer: Self-pay | Admitting: Internal Medicine

## 2022-11-12 DIAGNOSIS — Z1231 Encounter for screening mammogram for malignant neoplasm of breast: Secondary | ICD-10-CM

## 2022-11-24 DIAGNOSIS — F3342 Major depressive disorder, recurrent, in full remission: Secondary | ICD-10-CM | POA: Diagnosis not present

## 2022-11-24 DIAGNOSIS — Z0000 Encounter for general adult medical examination without abnormal findings: Secondary | ICD-10-CM | POA: Diagnosis not present

## 2022-11-24 DIAGNOSIS — I10 Essential (primary) hypertension: Secondary | ICD-10-CM | POA: Diagnosis not present

## 2022-11-24 DIAGNOSIS — I700 Atherosclerosis of aorta: Secondary | ICD-10-CM | POA: Diagnosis not present

## 2022-11-24 DIAGNOSIS — Z299 Encounter for prophylactic measures, unspecified: Secondary | ICD-10-CM | POA: Diagnosis not present

## 2022-12-29 ENCOUNTER — Ambulatory Visit: Payer: Medicare PPO

## 2023-01-26 ENCOUNTER — Ambulatory Visit: Payer: Medicare PPO

## 2023-03-16 DIAGNOSIS — K529 Noninfective gastroenteritis and colitis, unspecified: Secondary | ICD-10-CM | POA: Diagnosis not present

## 2023-03-16 DIAGNOSIS — D692 Other nonthrombocytopenic purpura: Secondary | ICD-10-CM | POA: Diagnosis not present

## 2023-03-16 DIAGNOSIS — I700 Atherosclerosis of aorta: Secondary | ICD-10-CM | POA: Diagnosis not present

## 2023-03-16 DIAGNOSIS — Z299 Encounter for prophylactic measures, unspecified: Secondary | ICD-10-CM | POA: Diagnosis not present

## 2023-03-16 DIAGNOSIS — R5383 Other fatigue: Secondary | ICD-10-CM | POA: Diagnosis not present

## 2023-03-16 DIAGNOSIS — F3342 Major depressive disorder, recurrent, in full remission: Secondary | ICD-10-CM | POA: Diagnosis not present

## 2023-04-22 DIAGNOSIS — Z299 Encounter for prophylactic measures, unspecified: Secondary | ICD-10-CM | POA: Diagnosis not present

## 2023-04-22 DIAGNOSIS — J111 Influenza due to unidentified influenza virus with other respiratory manifestations: Secondary | ICD-10-CM | POA: Diagnosis not present

## 2023-04-22 DIAGNOSIS — R6889 Other general symptoms and signs: Secondary | ICD-10-CM | POA: Diagnosis not present

## 2023-05-07 DIAGNOSIS — H43813 Vitreous degeneration, bilateral: Secondary | ICD-10-CM | POA: Diagnosis not present

## 2023-05-08 ENCOUNTER — Ambulatory Visit
Admission: RE | Admit: 2023-05-08 | Discharge: 2023-05-08 | Disposition: A | Discharge status: Home / Self Care | Source: Ambulatory Visit | Attending: Internal Medicine | Admitting: Internal Medicine

## 2023-05-08 DIAGNOSIS — Z1231 Encounter for screening mammogram for malignant neoplasm of breast: Secondary | ICD-10-CM

## 2023-05-13 DIAGNOSIS — H2512 Age-related nuclear cataract, left eye: Secondary | ICD-10-CM | POA: Diagnosis not present

## 2023-05-13 DIAGNOSIS — H26491 Other secondary cataract, right eye: Secondary | ICD-10-CM | POA: Diagnosis not present

## 2023-06-03 DIAGNOSIS — H2512 Age-related nuclear cataract, left eye: Secondary | ICD-10-CM | POA: Diagnosis not present

## 2023-06-03 DIAGNOSIS — F418 Other specified anxiety disorders: Secondary | ICD-10-CM | POA: Diagnosis not present

## 2023-06-03 DIAGNOSIS — H25812 Combined forms of age-related cataract, left eye: Secondary | ICD-10-CM | POA: Diagnosis not present

## 2023-06-23 DIAGNOSIS — L720 Epidermal cyst: Secondary | ICD-10-CM | POA: Diagnosis not present

## 2023-06-23 DIAGNOSIS — L738 Other specified follicular disorders: Secondary | ICD-10-CM | POA: Diagnosis not present

## 2023-06-23 DIAGNOSIS — Z85828 Personal history of other malignant neoplasm of skin: Secondary | ICD-10-CM | POA: Diagnosis not present

## 2023-06-23 DIAGNOSIS — D2261 Melanocytic nevi of right upper limb, including shoulder: Secondary | ICD-10-CM | POA: Diagnosis not present

## 2023-06-23 DIAGNOSIS — L821 Other seborrheic keratosis: Secondary | ICD-10-CM | POA: Diagnosis not present

## 2023-06-23 DIAGNOSIS — L570 Actinic keratosis: Secondary | ICD-10-CM | POA: Diagnosis not present

## 2023-09-01 DIAGNOSIS — Z1331 Encounter for screening for depression: Secondary | ICD-10-CM | POA: Diagnosis not present

## 2023-09-01 DIAGNOSIS — Z299 Encounter for prophylactic measures, unspecified: Secondary | ICD-10-CM | POA: Diagnosis not present

## 2023-09-01 DIAGNOSIS — Z7189 Other specified counseling: Secondary | ICD-10-CM | POA: Diagnosis not present

## 2023-09-01 DIAGNOSIS — Z0000 Encounter for general adult medical examination without abnormal findings: Secondary | ICD-10-CM | POA: Diagnosis not present

## 2023-09-01 DIAGNOSIS — I10 Essential (primary) hypertension: Secondary | ICD-10-CM | POA: Diagnosis not present

## 2023-09-01 DIAGNOSIS — Z1339 Encounter for screening examination for other mental health and behavioral disorders: Secondary | ICD-10-CM | POA: Diagnosis not present

## 2023-10-14 DIAGNOSIS — J029 Acute pharyngitis, unspecified: Secondary | ICD-10-CM | POA: Diagnosis not present

## 2023-10-14 DIAGNOSIS — Z299 Encounter for prophylactic measures, unspecified: Secondary | ICD-10-CM | POA: Diagnosis not present

## 2023-10-14 DIAGNOSIS — R059 Cough, unspecified: Secondary | ICD-10-CM | POA: Diagnosis not present

## 2023-10-14 DIAGNOSIS — R490 Dysphonia: Secondary | ICD-10-CM | POA: Diagnosis not present

## 2023-11-03 DIAGNOSIS — Z7189 Other specified counseling: Secondary | ICD-10-CM | POA: Diagnosis not present

## 2023-11-03 DIAGNOSIS — I10 Essential (primary) hypertension: Secondary | ICD-10-CM | POA: Diagnosis not present

## 2023-11-03 DIAGNOSIS — J029 Acute pharyngitis, unspecified: Secondary | ICD-10-CM | POA: Diagnosis not present

## 2023-11-03 DIAGNOSIS — R21 Rash and other nonspecific skin eruption: Secondary | ICD-10-CM | POA: Diagnosis not present

## 2023-11-03 DIAGNOSIS — Z299 Encounter for prophylactic measures, unspecified: Secondary | ICD-10-CM | POA: Diagnosis not present

## 2023-12-02 DIAGNOSIS — Z299 Encounter for prophylactic measures, unspecified: Secondary | ICD-10-CM | POA: Diagnosis not present

## 2023-12-02 DIAGNOSIS — I10 Essential (primary) hypertension: Secondary | ICD-10-CM | POA: Diagnosis not present

## 2023-12-02 DIAGNOSIS — E7800 Pure hypercholesterolemia, unspecified: Secondary | ICD-10-CM | POA: Diagnosis not present

## 2023-12-02 DIAGNOSIS — R0789 Other chest pain: Secondary | ICD-10-CM | POA: Diagnosis not present

## 2023-12-02 DIAGNOSIS — Z0000 Encounter for general adult medical examination without abnormal findings: Secondary | ICD-10-CM | POA: Diagnosis not present

## 2023-12-02 DIAGNOSIS — Z79899 Other long term (current) drug therapy: Secondary | ICD-10-CM | POA: Diagnosis not present

## 2023-12-02 DIAGNOSIS — R5383 Other fatigue: Secondary | ICD-10-CM | POA: Diagnosis not present

## 2023-12-15 DIAGNOSIS — E2839 Other primary ovarian failure: Secondary | ICD-10-CM | POA: Diagnosis not present

## 2024-01-11 DIAGNOSIS — H43813 Vitreous degeneration, bilateral: Secondary | ICD-10-CM | POA: Diagnosis not present
